# Patient Record
Sex: Female | Born: 1960
Health system: Southern US, Community
[De-identification: ages and names within clinical notes are randomized; demographics above are authoritative.]

## PROBLEM LIST (undated history)

## (undated) DIAGNOSIS — K635 Polyp of colon: Secondary | ICD-10-CM

## (undated) DIAGNOSIS — M8580 Other specified disorders of bone density and structure, unspecified site: Secondary | ICD-10-CM

## (undated) DIAGNOSIS — L719 Rosacea, unspecified: Secondary | ICD-10-CM

## (undated) HISTORY — DX: Polyp of colon: K63.5

## (undated) HISTORY — PX: NASAL SINUS SURGERY: SHX719

## (undated) HISTORY — PX: TEMPOROMANDIBULAR JOINT SURGERY: SHX35

## (undated) HISTORY — DX: Rosacea, unspecified: L71.9

---
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
## 1998-02-28 ENCOUNTER — Other Ambulatory Visit: Admission: RE | Admit: 1998-02-28 | Discharge: 1998-02-28 | Payer: Self-pay | Admitting: Obstetrics and Gynecology

## 2000-02-02 ENCOUNTER — Other Ambulatory Visit: Admission: RE | Admit: 2000-02-02 | Discharge: 2000-02-02 | Payer: Self-pay | Admitting: *Deleted

## 2000-08-19 ENCOUNTER — Other Ambulatory Visit: Admission: RE | Admit: 2000-08-19 | Discharge: 2000-08-19 | Payer: Self-pay | Admitting: Gynecology

## 2001-08-19 ENCOUNTER — Other Ambulatory Visit: Admission: RE | Admit: 2001-08-19 | Discharge: 2001-08-19 | Payer: Self-pay | Admitting: Gynecology

## 2002-08-28 ENCOUNTER — Other Ambulatory Visit: Admission: RE | Admit: 2002-08-28 | Discharge: 2002-08-28 | Payer: Self-pay | Admitting: Gynecology

## 2003-11-12 ENCOUNTER — Other Ambulatory Visit: Admission: RE | Admit: 2003-11-12 | Discharge: 2003-11-12 | Payer: Self-pay | Admitting: Gynecology

## 2004-11-19 ENCOUNTER — Other Ambulatory Visit: Admission: RE | Admit: 2004-11-19 | Discharge: 2004-11-19 | Payer: Self-pay | Admitting: Gynecology

## 2005-02-27 ENCOUNTER — Ambulatory Visit: Payer: Self-pay | Admitting: Internal Medicine

## 2005-03-03 ENCOUNTER — Ambulatory Visit: Payer: Self-pay | Admitting: Internal Medicine

## 2005-11-26 ENCOUNTER — Other Ambulatory Visit: Admission: RE | Admit: 2005-11-26 | Discharge: 2005-11-26 | Payer: Self-pay | Admitting: Gynecology

## 2006-10-01 ENCOUNTER — Ambulatory Visit: Payer: Self-pay | Admitting: Internal Medicine

## 2006-10-01 ENCOUNTER — Encounter: Payer: Self-pay | Admitting: Internal Medicine

## 2006-12-01 ENCOUNTER — Other Ambulatory Visit: Admission: RE | Admit: 2006-12-01 | Discharge: 2006-12-01 | Payer: Self-pay | Admitting: Gynecology

## 2006-12-01 ENCOUNTER — Encounter: Payer: Self-pay | Admitting: Internal Medicine

## 2007-01-04 ENCOUNTER — Encounter: Payer: Self-pay | Admitting: Internal Medicine

## 2007-05-11 ENCOUNTER — Encounter: Payer: Self-pay | Admitting: Internal Medicine

## 2007-10-06 ENCOUNTER — Encounter: Payer: Self-pay | Admitting: Internal Medicine

## 2007-12-01 ENCOUNTER — Ambulatory Visit: Payer: Self-pay | Admitting: Internal Medicine

## 2007-12-01 DIAGNOSIS — Z8742 Personal history of other diseases of the female genital tract: Secondary | ICD-10-CM

## 2007-12-01 DIAGNOSIS — J45991 Cough variant asthma: Secondary | ICD-10-CM

## 2007-12-01 DIAGNOSIS — J329 Chronic sinusitis, unspecified: Secondary | ICD-10-CM

## 2007-12-02 ENCOUNTER — Other Ambulatory Visit: Admission: RE | Admit: 2007-12-02 | Discharge: 2007-12-02 | Payer: Self-pay | Admitting: Gynecology

## 2007-12-02 ENCOUNTER — Encounter: Payer: Self-pay | Admitting: Internal Medicine

## 2008-04-10 ENCOUNTER — Encounter: Payer: Self-pay | Admitting: Internal Medicine

## 2008-10-09 ENCOUNTER — Encounter: Payer: Self-pay | Admitting: Internal Medicine

## 2008-11-29 ENCOUNTER — Ambulatory Visit: Payer: Self-pay | Admitting: Obstetrics and Gynecology

## 2008-12-26 ENCOUNTER — Ambulatory Visit: Payer: Self-pay | Admitting: Women's Health

## 2008-12-26 ENCOUNTER — Encounter: Payer: Self-pay | Admitting: Women's Health

## 2008-12-26 ENCOUNTER — Encounter: Payer: Self-pay | Admitting: Internal Medicine

## 2008-12-26 ENCOUNTER — Other Ambulatory Visit: Admission: RE | Admit: 2008-12-26 | Discharge: 2008-12-26 | Payer: Self-pay | Admitting: Gynecology

## 2009-01-02 ENCOUNTER — Telehealth (INDEPENDENT_AMBULATORY_CARE_PROVIDER_SITE_OTHER): Payer: Self-pay | Admitting: *Deleted

## 2009-01-10 ENCOUNTER — Ambulatory Visit: Payer: Self-pay | Admitting: Internal Medicine

## 2009-04-09 ENCOUNTER — Encounter: Payer: Self-pay | Admitting: Internal Medicine

## 2009-10-08 ENCOUNTER — Encounter: Payer: Self-pay | Admitting: Internal Medicine

## 2009-12-26 ENCOUNTER — Telehealth (INDEPENDENT_AMBULATORY_CARE_PROVIDER_SITE_OTHER): Payer: Self-pay | Admitting: *Deleted

## 2009-12-26 ENCOUNTER — Encounter (INDEPENDENT_AMBULATORY_CARE_PROVIDER_SITE_OTHER): Payer: Self-pay | Admitting: *Deleted

## 2010-01-29 ENCOUNTER — Ambulatory Visit: Payer: Self-pay | Admitting: Women's Health

## 2010-01-29 ENCOUNTER — Other Ambulatory Visit: Admission: RE | Admit: 2010-01-29 | Discharge: 2010-01-29 | Payer: Self-pay | Admitting: Gynecology

## 2010-01-29 ENCOUNTER — Encounter (INDEPENDENT_AMBULATORY_CARE_PROVIDER_SITE_OTHER): Payer: Self-pay | Admitting: *Deleted

## 2010-01-29 ENCOUNTER — Encounter: Payer: Self-pay | Admitting: Internal Medicine

## 2010-01-30 ENCOUNTER — Ambulatory Visit: Payer: Self-pay | Admitting: Internal Medicine

## 2010-02-20 ENCOUNTER — Ambulatory Visit: Payer: Self-pay | Admitting: Internal Medicine

## 2010-02-20 DIAGNOSIS — K635 Polyp of colon: Secondary | ICD-10-CM

## 2010-02-20 HISTORY — DX: Polyp of colon: K63.5

## 2010-02-24 ENCOUNTER — Encounter: Payer: Self-pay | Admitting: Internal Medicine

## 2010-04-11 ENCOUNTER — Encounter: Payer: Self-pay | Admitting: Internal Medicine

## 2010-06-26 NOTE — Progress Notes (Signed)
Summary: Colonoscopy referral  Phone Note Call from Patient Call back at [phone]   Summary of Call: Patient mother has been dx with colon cancer and she would like to inquire about colonoscopy referral. Would you like to see the patient in office prior? Please advise. Initial call taken by: Lucious Groves CMA,  December 26, 2009 2:27 PM  Follow-up for Phone Call        GI will need to see her first to schedule this as no recent medical evaluation in chart Follow-up by: Marga Melnick MD,  December 26, 2009 2:38 PM  Additional Follow-up for Phone Call Additional follow up Details #1::        GI will do a coloncoscopy because of family history. It is sch'd. Will inform patient.  Additional Follow-up by: Harold Barban,  December 26, 2009 2:45 PM  New Problems: NEOPLASM, MALIGNANT, COLON, FAMILY HX, MOTHER (ICD-V16.0)   New Problems: NEOPLASM, MALIGNANT, COLON, FAMILY HX, MOTHER (ICD-V16.0)

## 2010-06-26 NOTE — Miscellaneous (Signed)
Summary: [facility] previsit  Clinical Lists Changes  Medications: Added new medication of DULCOLAX 5 MG  TBEC (BISACODYL) Day before procedure take 2 at 3pm and 2 at 8pm. - Signed Added new medication of METOCLOPRAMIDE HCL 10 MG  TABS (METOCLOPRAMIDE HCL) As per prep instructions. - Signed Added new medication of MIRALAX   POWD (POLYETHYLENE GLYCOL 3350) As per prep  instructions. - Signed Rx of DULCOLAX 5 MG  TBEC (BISACODYL) Day before procedure take 2 at 3pm and 2 at 8pm.;  #4 x 0;  Signed;  Entered by: Karl Bales RN;  Authorized by: Hart Carwin MD;  Method used: Electronically to [facility] [address]. [phone]*, [address], [place], [place], [place]  [address], Ph: [phone] or [phone], Fax: [phone] Rx of METOCLOPRAMIDE HCL 10 MG  TABS (METOCLOPRAMIDE HCL) As per prep instructions.;  #2 x 0;  Signed;  Entered by: Karl Bales RN;  Authorized by: Hart Carwin MD;  Method used: Electronically to [facility] [address] [phone]*, [address], [place], [place], [place]  [address], Ph: [phone] or [phone], Fax: [phone] Rx of MIRALAX   POWD (POLYETHYLENE GLYCOL 3350) As per prep  instructions.;  #255gm x 0;  Signed;  Entered by: Karl Bales RN;  Authorized by: Hart Carwin MD;  Method used: Electronically to [facility] Dr. [phone]*, [address] [place], [place], [place]  [address], Ph: [phone] or [phone], Fax: [phone]    Prescriptions: MIRALAX   POWD (POLYETHYLENE GLYCOL 3350) As per prep  instructions.  #255gm x 0   Entered by:   Karl Bales RN   Authorized by:   Hart Carwin MD   Signed by:   Karl Bales RN on 01/30/2010   Method used:   Electronically to        [facility]* (retail)       [address]       [place], [place]  [address]       Ph: [phone] or [phone]       Fax: [phone]   RxID:   [id number] METOCLOPRAMIDE HCL 10 MG  TABS (METOCLOPRAMIDE HCL) As per  prep instructions.  #2 x 0   Entered by:   Karl Bales RN   Authorized by:   Hart Carwin MD   Signed by:   Karl Bales RN on 01/30/2010   Method used:   Electronically to        [facility]* (retail)       [address]       [place], [place]  [address]       Ph: [phone] or [phone]       Fax: [phone]   RxID:   [id number] DULCOLAX 5 MG  TBEC (BISACODYL) Day before procedure take 2 at 3pm and 2 at 8pm.  #4 x 0   Entered by:   Karl Bales RN   Authorized by:   Hart Carwin MD   Signed by:   Karl Bales RN on 01/30/2010   Method used:   Electronically to        [facility]* (retail)       [address]       [place], [place]  [address]       Ph: [phone] or [phone]  Fax: [phone]   RxID:   [id number]

## 2010-06-26 NOTE — Letter (Signed)
Summary: [facility]   Imported By: Lanelle Bal 02/11/2010 10:12:02  _____________________________________________________________________  External Attachment:    Type:   Image     Comment:   External Document

## 2010-06-26 NOTE — Letter (Signed)
Summary: [facility]  [facility]   Imported By: Lanelle Bal 10/26/2009 09:59:44  _____________________________________________________________________  External Attachment:    Type:   Image     Comment:   External Document

## 2010-06-26 NOTE — Letter (Signed)
Summary: [facility] Instructions  [facility]  [address] [place], [place] [address]   Phone: [phone]  Fax: [phone]       Tara Carpenter    Sep 20, 1960    MRN: [id number]       Procedure Day Dorna Bloom:  Lenor Coffin  02/20/10     Arrival Time: 9:00AM     Procedure Time:  10:00AM     Location of Procedure:                    [facility]iann Pares _  [facility] (4th Floor)   PREPARATION FOR COLONOSCOPY WITH MIRALAX  Starting 5 days prior to your procedure 02/15/10 do not eat nuts, seeds, popcorn, corn, beans, peas,  salads, or any raw vegetables.  Do not take any fiber supplements (e.g. Metamucil, Citrucel, and Benefiber). ____________________________________________________________________________________________________   THE DAY BEFORE YOUR PROCEDURE         DATE: 02/19/10 DAY: WEDNESDAY  1   Drink clear liquids the entire day-NO SOLID FOOD  2   Do not drink anything colored red or purple.  Avoid juices with pulp.  No orange juice.  3   Drink at least 64 oz. (8 glasses) of fluid/clear liquids during the day to prevent dehydration and help the prep work efficiently.  CLEAR LIQUIDS INCLUDE: Water Jello Ice Popsicles Tea (sugar ok, no milk/cream) Powdered fruit flavored drinks Coffee (sugar ok, no milk/cream) Gatorade Juice: apple, white grape, white cranberry  Lemonade Clear bullion, consomm, broth Carbonated beverages (any kind) Strained chicken noodle soup Hard Candy  4   Mix the entire bottle of Miralax with 64 oz. of Gatorade/Powerade in the morning and put in the refrigerator to chill.  5   At 3:00 pm take 2 Dulcolax/Bisacodyl tablets.  6   At 4:30 pm take one Reglan/Metoclopramide tablet.  7  Starting at 5:00 pm drink one 8 oz glass of the Miralax mixture every 15-20 minutes until you have finished drinking the entire 64 oz.  You should finish drinking prep around 7:30 or 8:00 pm.  8   If you are nauseated, you may take the 2nd Reglan/Metoclopramide  tablet at 6:30 pm.        9    At 8:00 pm take 2 more DULCOLAX/Bisacodyl tablets.     THE DAY OF YOUR PROCEDURE      DATE:  02/20/10  DAY: Lenor Coffin  You may drink clear liquids until 8:00AM  (2 HOURS BEFORE PROCEDURE).   MEDICATION INSTRUCTIONS  Unless otherwise instructed, you should take regular prescription medications with a small sip of water as early as possible the morning of your procedure.           OTHER INSTRUCTIONS  You will need a responsible adult at least 49 years of age to accompany you and drive you home.   This person must remain in the waiting room during your procedure.  Wear loose fitting clothing that is easily removed.  Leave jewelry and other valuables at home.  However, you may wish to bring a book to read or an iPod/MP3 player to listen to music as you wait for your procedure to start.  Remove all body piercing jewelry and leave at home.  Total time from sign-in until discharge is approximately 2-3 hours.  You should go home directly after your procedure and rest.  You can resume normal activities the day after your procedure.  The day of your procedure you should not:   Drive  Make legal decisions   Operate machinery   Drink alcohol   Return to work  You will receive specific instructions about eating, activities and medications before you leave.   The above instructions have been reviewed and explained to me by  Karl Bales RN  January 30, 2010 11:25 AM    I fully understand and can verbalize these instructions _____________________________ Date _______

## 2010-06-26 NOTE — Letter (Signed)
Summary: Patient Notice- Polyp Results  [facility]  [address] [place], [place] [address]   Phone: [phone]  Fax: [phone]        February 24, 2010 MRN: [id number]    SYLVIE MIFSUD [address] [place], [place]  [address]    Dear Ms. Ayub,  I am pleased to inform you that the colon polyp(s) removed during your recent colonoscopy was (were) found to be benign (no cancer detected) upon pathologic examination.The polyp was adenomatous ( precancerous)  I recommend you have a repeat colonoscopy examination in 5_ years to look for recurrent polyps, as having colon polyps increases your risk for having recurrent polyps or even colon cancer in the future.  Should you develop new or worsening symptoms of abdominal pain, bowel habit changes or bleeding from the rectum or bowels, please schedule an evaluation with either your primary care physician or with me.  Additional information/recommendations:  _x_ No further action with gastroenterology is needed at this time. Please      follow-up with your primary care physician for your other healthcare      needs.  __ Please call [phone] to schedule a return visit to review your      situation.  __ Please keep your follow-up visit as already scheduled.  __ Continue treatment plan as outlined the day of your exam.  Please call us if you are having persistent problems or have questions about your condition that have not been fully answered at this time.  Sincerely,  Hart Carwin MD  This letter has been electronically signed by your physician.  Appended Document: Patient Notice- Polyp Results letter mailed

## 2010-06-26 NOTE — Letter (Signed)
Summary: Previsit letter  [facility]  [address] [place], [place] [address]   Phone: [phone]  Fax: [phone]       12/26/2009 MRN: [id number]  Tara Carpenter [address] [place], [place]  [address]  Dear Ms. Acri,  Welcome to the Gastroenterology Division at [facility].    You are scheduled to see a nurse for your pre-procedure visit on 01-29-10 at 8:00a.m. on the 3rd floor at [facility], [address]  We ask that you try to arrive at our office 15 minutes prior to your appointment time to allow for check-in.  Your nurse visit will consist of discussing your medical and surgical history, your immediate family medical history, and your medications.    Please bring a complete list of all your medications or, if you prefer, bring the medication bottles and we will list them.  We will need to be aware of both prescribed and over the counter drugs.  We will need to know exact dosage information as well.  If you are on blood thinners (Coumadin, Plavix, Aggrenox, Ticlid, etc.) please call our office today/prior to your appointment, as we need to consult with your physician about holding your medication.   Please be prepared to read and sign documents such as consent forms, a financial agreement, and acknowledgement forms.  If necessary, and with your consent, a friend or relative is welcome to sit-in on the nurse visit with you.  Please bring your insurance card so that we may make a copy of it.  If your insurance requires a referral to see a specialist, please bring your referral form from your primary care physician.  No co-pay is required for this nurse visit.     If you cannot keep your appointment, please call [phone] to cancel or reschedule prior to your appointment date.  This allows [place] the opportunity to schedule an appointment for another patient in need of care.    Thank you for choosing [facility] for your medical needs.   We appreciate the opportunity to care for you.  Please visit [place] at our website  to learn more about our practice.                     Sincerely.                                                                                                                   The Gastroenterology Division

## 2010-06-26 NOTE — Procedures (Signed)
Summary: Colonoscopy  Patient: Tara Carpenter Note: All result statuses are Final unless otherwise noted.  Tests: (1) Colonoscopy (COL)   COL Colonoscopy           DONE     [facility]     [address]     [place], [place]  [address]           COLONOSCOPY PROCEDURE REPORT           PATIENT:  Tara, Carpenter  MR#:  [id number]     BIRTHDATE:  10-23-1960, 49 yrs. old  GENDER:  female     ENDOSCOPIST:  Hedwig Morton. Juanda Chance, MD     REF. BY:  Marga Melnick, M.D.     PROCEDURE DATE:  02/20/2010     PROCEDURE:  Colonoscopy 19147     ASA CLASS:  Class I     INDICATIONS:  Elevated Risk Screening mother with colon ca just     diagnosed, Stage 4,     MEDICATIONS:   Versed 8 mg, Fentanyl 75 mcg           DESCRIPTION OF PROCEDURE:   After the risks benefits and     alternatives of the procedure were thoroughly explained, informed     consent was obtained.  Digital rectal exam was performed and     revealed no rectal masses.   The LB [id number] endoscope     was introduced through the anus and advanced to the cecum, which     was identified by both the appendix and ileocecal valve, without     limitations.  The quality of the prep was excellent, using     MiraLax.  The instrument was then slowly withdrawn as the colon     was fully examined.     <<PROCEDUREIMAGES>>           FINDINGS:  Two polyps were found in the sigmoid colon. 5mm polyp     at 20 cm, 9mm polyp at 15 cm, sessile Polyps were snared without     cautery. Retrieval was successful (see image2, image6, image7, and     image1). snare polyp  This was otherwise a normal examination of     the colon (see image8, image5, image4, and image3).   Retroflexed     views in the rectum revealed no abnormalities.    The scope was     then withdrawn from the patient and the procedure completed.           COMPLICATIONS:  None     ENDOSCOPIC IMPRESSION:     1) Two polyps in the sigmoid colon     2) Otherwise normal examination   s/p cold snare polypectomy x 2     RECOMMENDATIONS:     1) Await pathology results     2) High fiber diet.     h     REPEAT EXAM:  In 5 year(s) for.           ______________________________     Hedwig Morton. Juanda Chance, MD           CC:           n.     eSIGNED:   Hedwig Morton. Brodie at 02/20/2010 10:04 AM           Benard Halsted, [phone]  Note: An exclamation mark (!) indicates a result that was not dispersed into the flowsheet. Document Creation Date: 02/20/2010 10:05 AM _______________________________________________________________________  (  1) Order result status: Final Collection or observation date-time: 02/20/2010 09:55 Requested date-time:  Receipt date-time:  Reported date-time:  Referring Physician:   Ordering Physician: Lina Sar [phone]) Specimen Source:  Source: Launa Grill Order Number: [id number] Lab site:   Appended Document: Colonoscopy     Procedures Next Due Date:    Colonoscopy: 02/2015

## 2010-07-24 ENCOUNTER — Other Ambulatory Visit: Payer: Self-pay | Admitting: Dermatology

## 2010-10-21 ENCOUNTER — Encounter: Payer: Self-pay | Admitting: Internal Medicine

## 2010-12-01 ENCOUNTER — Ambulatory Visit (INDEPENDENT_AMBULATORY_CARE_PROVIDER_SITE_OTHER): Payer: Managed Care, Other (non HMO) | Admitting: Women's Health

## 2010-12-01 DIAGNOSIS — Z79890 Hormone replacement therapy (postmenopausal): Secondary | ICD-10-CM

## 2010-12-01 DIAGNOSIS — N951 Menopausal and female climacteric states: Secondary | ICD-10-CM

## 2011-02-04 ENCOUNTER — Other Ambulatory Visit (HOSPITAL_COMMUNITY)
Admission: RE | Admit: 2011-02-04 | Discharge: 2011-02-04 | Disposition: A | Discharge status: Home / Self Care | Payer: Managed Care, Other (non HMO) | Source: Ambulatory Visit | Attending: Women's Health | Admitting: Women's Health

## 2011-02-04 ENCOUNTER — Ambulatory Visit (INDEPENDENT_AMBULATORY_CARE_PROVIDER_SITE_OTHER): Payer: Managed Care, Other (non HMO) | Admitting: Women's Health

## 2011-02-04 ENCOUNTER — Encounter: Payer: Self-pay | Admitting: Women's Health

## 2011-02-04 VITALS — BP 116/74 | Ht 63.0 in | Wt 137.0 lb

## 2011-02-04 DIAGNOSIS — Z833 Family history of diabetes mellitus: Secondary | ICD-10-CM

## 2011-02-04 DIAGNOSIS — Z01419 Encounter for gynecological examination (general) (routine) without abnormal findings: Secondary | ICD-10-CM

## 2011-02-04 DIAGNOSIS — Z780 Asymptomatic menopausal state: Secondary | ICD-10-CM

## 2011-02-04 DIAGNOSIS — N951 Menopausal and female climacteric states: Secondary | ICD-10-CM

## 2011-02-04 DIAGNOSIS — R82998 Other abnormal findings in urine: Secondary | ICD-10-CM

## 2011-02-04 DIAGNOSIS — Z13220 Encounter for screening for lipoid disorders: Secondary | ICD-10-CM

## 2011-02-04 MED ORDER — PROGESTERONE MICRONIZED 200 MG PO CAPS: 200.0000 mg | ORAL_CAPSULE | Freq: Every day | ORAL | Status: DC

## 2011-02-04 MED ORDER — ESTRADIOL 0.05 MG/24HR TD PTTW: 1.0000 | MEDICATED_PATCH | TRANSDERMAL | Status: DC

## 2011-02-04 NOTE — Patient Instructions (Signed)
dexa Vit d 1000 Fish oil daily  <20 gm sat fat /d >20 fiber rich foods

## 2011-02-04 NOTE — Progress Notes (Signed)
Tara Carpenter 05-23-1961 [id number]    History:    The patient presents for annual exam.  History of a normal colonoscopy in Oct 17, 2009, mother died of colon cancer at age 49. She also has chronic sinusitis with asthma that is controlled with prednisone, Sporanox and Singulair prescribed by her pulmonologist..   Past medical history, past surgical history, family history and social history were all reviewed and documented in the EPIC chart.   ROS:  A  ROS was performed and pertinent positives and negatives are included in the history.  Exam:  Filed Vitals:   02/04/11 0813  BP: 116/74    General appearance:  Normal Head/Neck:  Normal, without cervical or supraclavicular adenopathy. Thyroid:  Symmetrical, normal in size, without palpable masses or nodularity. Respiratory  Effort:  Normal  Auscultation:  Clear without wheezing or rhonchi Cardiovascular  Auscultation:  Regular rate, without rubs, murmurs or gallops  Edema/varicosities:  Not grossly evident Abdominal  Soft,nontender, without masses, guarding or rebound.  Liver/spleen:  No organomegaly noted  Hernia:  None appreciated  Skin  Inspection:  Grossly normal  Palpation:  Grossly normal Neurologic/psychiatric  Orientation:  Normal with appropriate conversation.  Mood/affect:  Normal  Genitourinary    Breasts: Examined lying and sitting.     Right: Without masses, retractions, discharge or axillary adenopathy.     Left: Without masses, retractions, discharge or axillary adenopathy.   Inguinal/mons:  Normal without inguinal adenopathy  External genitalia:  Normal  BUS/Urethra/Skene's glands:  Normal  Bladder:  Normal  Vagina:  Normal  Cervix:  Normal  Uterus:  retroverted, normal in size, shape and contour.  Midline and mobile  Adnexa/parametria:     Rt: Without masses or tenderness.   Lt: Without masses or tenderness.  Anus and perineum: Normal  Digital rectal exam: Normal sphincter tone without palpated masses or  tenderness  Assessment/Plan:  50 y.o. MWF G3P3 for annual exam/husband vasectomy.Marland Kitchen FSH was 84 July of 2012 with numerous menopausal symptoms, had been amenorrheic for 3 months, and was started on Prometrium 200 day one through 12 and estradiol patch 0.05 weekly. She has done well on HRT, except states is still having a monthly cycle and wonders if she could prevent back. Did review she may spot daily if giving continuous progesterone. Will continue with the monthly withdrawal did review this will hopefully lighten. Did review risks of blood clots,strokes and accepts. History of GDM,  Normal GYN  exam on HRT with good relief of symptoms.  Plan: CBC, glucose, lipid profile, vitamin D, UA and Pap schedule bone density here at the office. Continue regular exercise, calcium rich diet, vitamin D 1000 daily encouraged SBEs, yearly mammogram, due in December. She's had some elevated triglycerides in the past encouraged increase fiber rich foods greater than 20 g per day and decreasing saturated fat to less than 20. She was seen by primary care who elected not to place on cholesterol-lowering medication.    Harrington Challenger [facility], 9:19 AM 02/04/2011

## 2011-03-13 ENCOUNTER — Telehealth: Payer: Self-pay | Admitting: *Deleted

## 2011-03-13 NOTE — Telephone Encounter (Signed)
Patient called and asked to verify how many patches she uses a week.  Checked number ordered was 8 patches.  Told patient to use twice weekly.

## 2011-03-23 ENCOUNTER — Telehealth: Payer: Self-pay | Admitting: Internal Medicine

## 2011-03-23 NOTE — Telephone Encounter (Signed)
Spoke with patient and recommended ER, patient stated this is not a chest pain, feels like something is holding her breath (last about 3-5 sec), happens at least once daily x 3-4 weeks. Patient repeatedly said this is not chest pain, I still recommended ER. Patient requested appointment with Dr.Hopper for tomorrow and indicated she will go to ER if symptoms become unbearable.  Appointment scheduled

## 2011-03-24 ENCOUNTER — Encounter: Payer: Self-pay | Admitting: Internal Medicine

## 2011-03-24 ENCOUNTER — Ambulatory Visit (INDEPENDENT_AMBULATORY_CARE_PROVIDER_SITE_OTHER): Payer: Managed Care, Other (non HMO) | Admitting: Internal Medicine

## 2011-03-24 DIAGNOSIS — Z8742 Personal history of other diseases of the female genital tract: Secondary | ICD-10-CM

## 2011-03-24 DIAGNOSIS — R0789 Other chest pain: Secondary | ICD-10-CM

## 2011-03-24 DIAGNOSIS — R002 Palpitations: Secondary | ICD-10-CM

## 2011-03-24 DIAGNOSIS — Z8249 Family history of ischemic heart disease and other diseases of the circulatory system: Secondary | ICD-10-CM

## 2011-03-24 LAB — TSH: TSH: 1.19 u[IU]/mL (ref 0.35–5.50)

## 2011-03-24 LAB — HEMOGLOBIN A1C: Hgb A1c MFr Bld: 5.8 % (ref 4.6–6.5)

## 2011-03-24 LAB — BASIC METABOLIC PANEL
CO2: 28 mEq/L (ref 19–32)
Calcium: 9.2 mg/dL (ref 8.4–10.5)
Creatinine, Ser: 0.7 mg/dL (ref 0.4–1.2)
GFR: 88.61 mL/min (ref >60.00)
Sodium: 141 mEq/L (ref 135–145)

## 2011-03-24 LAB — MAGNESIUM: Magnesium: 2.3 mg/dL (ref 1.5–2.5)

## 2011-03-24 LAB — T4, FREE: Free T4: 0.76 ng/dL (ref 0.60–1.60)

## 2011-03-24 NOTE — Patient Instructions (Signed)
Please  schedule fasting Labs : [facility] panel (no TSH or uric acid).  Please bring these instructions to that Lab appt.

## 2011-03-24 NOTE — Progress Notes (Signed)
  Subjective:    Patient ID: Tara Carpenter, female    DOB: [birth year], 50 y.o.   MRN: [id number]  HPI CHEST DISCOMFORT: Location: LSB  Quality: "constricting"  Duration: few seconds  Onset : 1 year ago; daily x 3 weeks Radiation: no  Better with: no relievers  Worse with: no triggers or exacerbating factors Symptoms History of Trauma/lifting: no  Nausea/vomiting: no  Diaphoresis: no  Shortness of breath: no  Pleuritic: no  Cough: no  Edema: no  Orthopnea: no  PND: no ; separate unassociated asthma symptoms  Dizziness: yes, with the pressure  Palpitations: yes, this occurs first  Syncope: no  Indigestion: no   Red Flags Worse with exertion: no  Recent Immobility: no  Tearing/radiation to back: no  Family history is positive for myocardial infarction in her father; he died of a myocardial infarction at 67.       Review of Systems she denies dysphagia, melena, rectal bleeding,  or abdominal pain     Objective:   Physical Exam General appearance is one of good health and nourishment w/o distress.  Eyes: No conjunctival inflammation or scleral icterus is present.  Oral exam: Dental hygiene is good; lips and gums are healthy appearing.There is no oropharyngeal erythema or exudate noted.   Heart:  Normal rate and regular rhythm. S1 and S2 normal without gallop, murmur, click, rub or other extra sounds     Lungs:Chest clear to auscultation; no wheezes, rhonchi,rales ,or rubs present.No increased work of breathing. There is no chest wall tenderness in the area of the pain.  Abdomen: bowel sounds normal, soft and non-tender without masses, organomegaly or hernias noted.  No guarding or rebound   Skin:Warm & dry.  Intact without suspicious lesions or rashes ; no jaundice  Lymphatic: No lymphadenopathy is noted about the head, neck, axilla  areas.   Muscle skeletal: No clubbing, cyanosis, or edema. Homans sign is negative bilaterally.            Assessment &  Plan:  #1 chest pain, atypical. Last episode 10/29  #2 family history of premature coronary disease in her father; MI at 80  #3 postmenopausal state  Plan: See orders and recommendations.  EKG is normal. The computer described low voltage in the precordial leads. This is related to breast tissue and is normal. No ischemic changes are present; no dysrhythmias noted

## 2011-03-25 LAB — CK TOTAL AND CKMB (NOT AT ARMC)
CK, MB: 1.3 ng/mL (ref 0.3–4.0)
Total CK: 51 U/L (ref 7–177)

## 2011-03-25 LAB — D-DIMER, QUANTITATIVE: D-Dimer, Quant: 0.25 ug/mL-FEU (ref 0.00–0.48)

## 2011-04-01 ENCOUNTER — Other Ambulatory Visit: Payer: Managed Care, Other (non HMO)

## 2011-04-08 ENCOUNTER — Other Ambulatory Visit (INDEPENDENT_AMBULATORY_CARE_PROVIDER_SITE_OTHER): Payer: Managed Care, Other (non HMO)

## 2011-04-08 DIAGNOSIS — Z8249 Family history of ischemic heart disease and other diseases of the circulatory system: Secondary | ICD-10-CM

## 2011-04-08 DIAGNOSIS — R002 Palpitations: Secondary | ICD-10-CM

## 2011-04-08 DIAGNOSIS — R0789 Other chest pain: Secondary | ICD-10-CM

## 2011-04-08 NOTE — Progress Notes (Signed)
12  

## 2011-05-08 ENCOUNTER — Encounter: Payer: Self-pay | Admitting: Internal Medicine

## 2011-05-14 ENCOUNTER — Encounter: Payer: Self-pay | Admitting: Women's Health

## 2011-05-29 ENCOUNTER — Encounter: Payer: Self-pay | Admitting: Internal Medicine

## 2011-05-29 ENCOUNTER — Ambulatory Visit (INDEPENDENT_AMBULATORY_CARE_PROVIDER_SITE_OTHER): Payer: Managed Care, Other (non HMO) | Admitting: Internal Medicine

## 2011-05-29 DIAGNOSIS — E785 Hyperlipidemia, unspecified: Secondary | ICD-10-CM | POA: Insufficient documentation

## 2011-05-29 DIAGNOSIS — Z8249 Family history of ischemic heart disease and other diseases of the circulatory system: Secondary | ICD-10-CM

## 2011-05-29 MED ORDER — NIACIN ER (ANTIHYPERLIPIDEMIC) 500 MG PO TBCR: 500.0000 mg | EXTENDED_RELEASE_TABLET | Freq: Every day | ORAL | Status: DC

## 2011-05-29 MED ORDER — EZETIMIBE 10 MG PO TABS: 10.0000 mg | ORAL_TABLET | Freq: Every day | ORAL | Status: DC

## 2011-05-29 NOTE — Assessment & Plan Note (Addendum)
[facility] panel 04/08/11: Hyperabsorber of  cholesterol; direct LDL 160;Lp(a) 109 (< 20); TG 185;LpPLA2 267 (<200). Therapeutic interventions would include statin plus or minus ezetimibe; Niaspan; and low-carb nutritional program.  She takes Sporanox chronically for her chronic sinusitis. A statin would not be an option.  Zetia, Niaspan, and optimal nutrition will be pursued.

## 2011-05-29 NOTE — Patient Instructions (Signed)
The most common cause of elevated triglycerides is the ingestion of sugar from high fructose corn syrup sources added to processed foods & drinks.  Eat a low-fat diet with lots of fruits and vegetables, up to 7-9 servings per day. Consume less than 30 grams of sugar per day from foods & drinks with High Fructose Corn Syrup (HFCS) sugar as #1,2,3 or # 4 on label.Whole Foods, [employer] & [employer] do not carry products with HFCS. Follow a  low carb nutrition program such as [place] or The New Sugar Busters  to prevent Diabetes progression . White carbohydrates (potatoes, rice, bread, and pasta) have a high spike of sugar and a high load of sugar. For example a  baked potato has a cup of sugar and a  french fry  2 teaspoons of sugar. Yams, wild  rice, whole grained bread &  wheat pasta have been much lower spike and load of  sugar. Portions should be the size of a deck of cards or your palm.  Please  schedule fasting Labs : Lipids, hepatic panel, CK in 10-12 weeks after meds & diet changes.  Please review Dr Gildardo Griffes book Eat, Drink & Be Healthy for dietary cholesterol information.  PLEASE BRING THESE INSTRUCTIONS TO FOLLOW UP  LAB APPOINTMENT.This will guarantee correct labs are drawn, eliminating need for repeat blood sampling ( needle sticks ! ). Diagnoses /Codes: 272.4,995.20,V17.3

## 2011-05-29 NOTE — Progress Notes (Signed)
  Subjective:    Patient ID: Tara Carpenter, female    DOB: [birth year], 51 y.o.   MRN: [id number]  HPI Dyslipidemia assessment: Prior Advanced Lipid Testing: NMR 2010   Family history of premature CAD/ MI: father MI @ 45 .  Nutrition: heart healthy .  Exercise: 4-5 x/ week including cardio . Diabetes : A1c 5.4% . HTN: no. Smoking history  : never .    Lab results reviewed :     Review of Systems Weight : stable. ROS: fatigue: no ; chest pain : no ;claudication: no; palpitations: no; abd pain/bowel changes: no ; myalgias:no;  syncope : no ; skin changes: no.     Objective:   Physical Exam   She is healthy and well-nourished; weight appears to be optimal.  There is no scleral icterus or jaundice.  Thyroid is normal to palpation without enlargement or nodularity.  She has an S4 with no significant murmur or gallops. Rhythm and rate are normal.  Chest is clear to auscultation with no increased work of breathing  All pulses are intact without bruits or deficit.  She is focused and intelligent and understands the risk we discussed in detail.        Assessment & Plan:

## 2011-07-16 ENCOUNTER — Ambulatory Visit (INDEPENDENT_AMBULATORY_CARE_PROVIDER_SITE_OTHER): Payer: Managed Care, Other (non HMO) | Admitting: Women's Health

## 2011-07-16 ENCOUNTER — Encounter: Payer: Self-pay | Admitting: Women's Health

## 2011-07-16 DIAGNOSIS — Z79890 Hormone replacement therapy (postmenopausal): Secondary | ICD-10-CM

## 2011-07-16 DIAGNOSIS — N9089 Other specified noninflammatory disorders of vulva and perineum: Secondary | ICD-10-CM

## 2011-07-16 MED ORDER — ESTRADIOL 0.05 MG/24HR TD PTWK: 1.0000 | MEDICATED_PATCH | TRANSDERMAL | Status: DC

## 2011-07-16 NOTE — Progress Notes (Signed)
Patient ID: FATIMAH SUNDQUIST, female   DOB: [birth year], 51 y.o.   MRN: [id number] Has been on estradiol 0.05 patch weekly and Prometrium 200 daily 1 through 12 with good relief of  menopausal symptoms since 7/12. Has had some irregular bleeding some months. Currently on cycle. States does bleed for several days after finishing the Prometrium each month. States also is having an irritation on the left labia. Denies any urinary symptoms or vaginal discharge.  Exam: Left labia 1 cm ulcerated superficial area. No blister areas noted, mild erythema, minimal tenderness. Denies HSV history.  DUB on HRT Left labial ulcer  Plan: HSV culture of left labia pending. Will apply triple antibiotic cream to it  until culture results. Encouraged loose close to avoid further irritation. Instructed to keep menstrual record and will call office with timing of bleeding or spotting. Reviewed normal to have some bleeding after Prometrium initially.

## 2011-07-20 ENCOUNTER — Encounter: Payer: Self-pay | Admitting: Women's Health

## 2011-07-21 ENCOUNTER — Encounter: Payer: Self-pay | Admitting: Women's Health

## 2011-07-22 ENCOUNTER — Ambulatory Visit (INDEPENDENT_AMBULATORY_CARE_PROVIDER_SITE_OTHER): Payer: Managed Care, Other (non HMO)

## 2011-07-22 DIAGNOSIS — Z780 Asymptomatic menopausal state: Secondary | ICD-10-CM

## 2011-07-22 DIAGNOSIS — M949 Disorder of cartilage, unspecified: Secondary | ICD-10-CM

## 2011-07-22 DIAGNOSIS — M8580 Other specified disorders of bone density and structure, unspecified site: Secondary | ICD-10-CM

## 2011-07-23 ENCOUNTER — Encounter: Payer: Self-pay | Admitting: Gynecology

## 2011-07-23 ENCOUNTER — Other Ambulatory Visit: Payer: Self-pay | Admitting: Gynecology

## 2011-07-23 DIAGNOSIS — M8580 Other specified disorders of bone density and structure, unspecified site: Secondary | ICD-10-CM

## 2011-09-04 ENCOUNTER — Telehealth: Payer: Self-pay | Admitting: *Deleted

## 2011-09-04 NOTE — Telephone Encounter (Signed)
Telephone call, states has bleeding most months twice a month now sometimes during the Prometrium other times after. Frequency of bleeding has increased. States bleeding lasts 4-5 days, sometimes bright red or dark. States menopausal symptoms are resolved. Reviewed not normal. Will get a sonohysterogram scheduled with Dr. Lily Peer to be sure no problem in the uterus. Postmenopausal on estradiol patch weekly and Prometrium 200 day one through 12 each month  Victorino Dike  Please call patient and schedule sonohysterogram with Dr Lily Peer

## 2011-09-04 NOTE — Telephone Encounter (Signed)
PT IS CALLING TO FOLLOW UP WITH LAST OFFICE VISIT 07/16/11 REGARDING BLEEDING WHILE ON HRT. PT SAID THAT SINCE FEB 21 OV SHE HAS HAD 4 PERIOD AND IS TRIED OF IT. PT WAS TOLD TO CALL. PLEASE ADVISE

## 2011-09-04 NOTE — Telephone Encounter (Signed)
Message left on both cell and home phone number.

## 2011-09-06 NOTE — Telephone Encounter (Signed)
Tara Carpenter has sonohysterogram been scheduled this week fo this patient with me? [name] had sent you a note requested that it be scheduled. I just want to follow up

## 2011-09-07 NOTE — Telephone Encounter (Signed)
Spoke with pt regarding the below note, pt transferred to appointment desk to schedule.

## 2011-09-10 ENCOUNTER — Other Ambulatory Visit: Payer: Self-pay | Admitting: Gynecology

## 2011-09-10 DIAGNOSIS — N950 Postmenopausal bleeding: Secondary | ICD-10-CM

## 2011-09-17 ENCOUNTER — Other Ambulatory Visit: Payer: Self-pay | Admitting: Gynecology

## 2011-09-17 ENCOUNTER — Ambulatory Visit (INDEPENDENT_AMBULATORY_CARE_PROVIDER_SITE_OTHER): Payer: BC Managed Care – PPO | Admitting: Gynecology

## 2011-09-17 ENCOUNTER — Ambulatory Visit (INDEPENDENT_AMBULATORY_CARE_PROVIDER_SITE_OTHER): Payer: BC Managed Care – PPO

## 2011-09-17 DIAGNOSIS — N950 Postmenopausal bleeding: Secondary | ICD-10-CM

## 2011-09-17 DIAGNOSIS — N8300 Follicular cyst of ovary, unspecified side: Secondary | ICD-10-CM

## 2011-09-17 DIAGNOSIS — N831 Corpus luteum cyst of ovary, unspecified side: Secondary | ICD-10-CM

## 2011-09-17 DIAGNOSIS — N83209 Unspecified ovarian cyst, unspecified side: Secondary | ICD-10-CM

## 2011-09-17 NOTE — Patient Instructions (Signed)
Ovarian Cyst The ovaries are small organs that are on each side of the uterus. The ovaries are the organs that produce the female hormones, estrogen and progesterone. An ovarian cyst is a sac filled with fluid that can vary in its size. It is normal for a small cyst to form in women who are in the childbearing age and who have menstrual periods. This type of cyst is called a follicle cyst that becomes an ovulation cyst (corpus luteum cyst) after it produces the women's egg. It later goes away on its own if the woman does not become pregnant. There are other kinds of ovarian cysts that may cause problems and may need to be treated. The most serious problem is a cyst with cancer. It should be noted that menopausal women who have an ovarian cyst are at a higher risk of it being a cancer cyst. They should be evaluated very quickly, thoroughly and followed closely. This is especially true in menopausal women because of the high rate of ovarian cancer in women in menopause. CAUSES AND TYPES OF OVARIAN CYSTS:  FUNCTIONAL CYST: The follicle/corpus luteum cyst is a functional cyst that occurs every month during ovulation with the menstrual cycle. They go away with the next menstrual cycle if the woman does not get pregnant. Usually, there are no symptoms with a functional cyst.   ENDOMETRIOMA CYST: This cyst develops from the lining of the uterus tissue. This cyst gets in or on the ovary. It grows every month from the bleeding during the menstrual period. It is also called a "chocolate cyst" because it becomes filled with blood that turns brown. This cyst can cause pain in the lower abdomen during intercourse and with your menstrual period.   CYSTADENOMA CYST: This cyst develops from the cells on the outside of the ovary. They usually are not cancerous. They can get very big and cause lower abdomen pain and pain with intercourse. This type of cyst can twist on itself, cut off its blood supply and cause severe pain.  It also can easily rupture and cause a lot of pain.   DERMOID CYST: This type of cyst is sometimes found in both ovaries. They are found to have different kinds of body tissue in the cyst. The tissue includes skin, teeth, hair, and/or cartilage. They usually do not have symptoms unless they get very big. Dermoid cysts are rarely cancerous.   POLYCYSTIC OVARY: This is a rare condition with hormone problems that produces many small cysts on both ovaries. The cysts are follicle-like cysts that never produce an egg and become a corpus luteum. It can cause an increase in body weight, infertility, acne, increase in body and facial hair and lack of menstrual periods or rare menstrual periods. Many women with this problem develop type 2 diabetes. The exact cause of this problem is unknown. A polycystic ovary is rarely cancerous.   THECA LUTEIN CYST: Occurs when too much hormone (human chorionic gonadotropin) is produced and over-stimulates the ovaries to produce an egg. They are frequently seen when doctors stimulate the ovaries for invitro-fertilization (test tube babies).   LUTEOMA CYST: This cyst is seen during pregnancy. Rarely it can cause an obstruction to the birth canal during labor and delivery. They usually go away after delivery.  SYMPTOMS   Pelvic pain or pressure.   Pain during sexual intercourse.   Increasing girth (swelling) of the abdomen.   Abnormal menstrual periods.   Increasing pain with menstrual periods.   You stop having   menstrual periods and you are not pregnant.  DIAGNOSIS  The diagnosis can be made during:  Routine or annual pelvic examination (common).   Ultrasound.   X-ray of the pelvis.   CT Scan.   MRI.   Blood tests.  TREATMENT   Treatment may only be to follow the cyst monthly for 2 to 3 months with your caregiver. Many go away on their own, especially functional cysts.   May be aspirated (drained) with a long needle with ultrasound, or by laparoscopy  (inserting a tube into the pelvis through a small incision).   The whole cyst can be removed by laparoscopy.   Sometimes the cyst may need to be removed through an incision in the lower abdomen.   Hormone treatment is sometimes used to help dissolve certain cysts.   Birth control pills are sometimes used to help dissolve certain cysts.  HOME CARE INSTRUCTIONS  Follow your caregiver's advice regarding:  Medicine.   Follow up visits to evaluate and treat the cyst.   You may need to come back or make an appointment with another caregiver, to find the exact cause of your cyst, if your caregiver is not a gynecologist.   Get your yearly and recommended pelvic examinations and Pap tests.   Let your caregiver know if you have had an ovarian cyst in the past.  SEEK MEDICAL CARE IF:   Your periods are late, irregular, they stop, or are painful.   Your stomach (abdomen) or pelvic pain does not go away.   Your stomach becomes larger or swollen.   You have pressure on your bladder or trouble emptying your bladder completely.   You have painful sexual intercourse.   You have feelings of fullness, pressure, or discomfort in your stomach.   You lose weight for no apparent reason.   You feel generally ill.   You become constipated.   You lose your appetite.   You develop acne.   You have an increase in body and facial hair.   You are gaining weight, without changing your exercise and eating habits.   You think you are pregnant.  SEEK IMMEDIATE MEDICAL CARE IF:   You have increasing abdominal pain.   You feel sick to your stomach (nausea) and/or vomit.   You develop a fever that comes on suddenly.   You develop abdominal pain during a bowel movement.   Your menstrual periods become heavier than usual.  Document Released: 05/11/2005 Document Revised: 04/30/2011 Document Reviewed: 03/14/2009 [employer] Patient Information 2012 [employer]  Intrauterine Device  Information  An intrauterine device (IUD) is inserted into your uterus and prevents pregnancy. There are 2 types of IUDs available:  Copper IUD. This type of IUD is wrapped in copper wire and is placed inside the uterus. Copper makes the uterus and fallopian tubes produce a fluid that kills sperm. The copper IUD can stay in place for 10 years.   Hormone IUD. This type of IUD contains the hormone progestin (synthetic progesterone). The hormone thickens the cervical mucus and prevents sperm from entering the uterus, and it also thins the uterine lining to prevent implantation of a fertilized egg. The hormone can weaken or kill the sperm that get into the uterus. The hormone IUD can stay in place for 5 years.  Your caregiver will make sure you are a good candidate for a contraceptive IUD. Discuss with your caregiver the possible side effects. ADVANTAGES  It is highly effective, reversible, long-acting, and low maintenance.  There are no estrogen-related side effects.   An IUD can be used when breastfeeding.   It is not associated with weight gain.   It works immediately after insertion.   The copper IUD does not interfere with your female hormones.   The progesterone IUD can make heavy menstrual periods lighter.   The progesterone IUD can be used for 5 years.   The copper IUD can be used for 10 years.  DISADVANTAGES  The progesterone IUD can be associated with irregular bleeding patterns.   The copper IUD can make your menstrual flow heavier and more painful.   You may experience cramping and vaginal bleeding after insertion.  Document Released: 04/14/2004 Document Revised: 04/30/2011 Document Reviewed: 09/13/2010 [facility] Patient Information 2012 [place], [place].

## 2011-09-17 NOTE — Progress Notes (Signed)
Patient is a 51 year old who last year had been placed on hormone replacement therapy consisting of estradiol 0.05 patch weekly with the addition of Prometrium 200 for 12 days of each month. Patient presented today for evaluation of her irregular bleeding. She stated started since she went on hormone replacement therapy. Patient states that she has had good compliance.  Ultrasound: Uterus measured 8.5 x 6.0 x 5.2 cm with endometrial stripe of 11.1 mm. Right ovary echo-free cyst measuring 15 x 14 mm negative color flow. Left ovary thickwalled cystic mass with internal low level echoes echogenic negative color flow measuring 20 x 18 x 19 mm negative fluid in the cul-de-sac. Son infusion histogram with no intracavitary defect  Patient was counseled and she underwent an endometrial biopsy of the cervix had been cleansed with Betadine solution. The uterus sounded to 7 cm and a Pipelle was introduced into the intrauterine cavity and sample of tissue was obtained and submitted for histological evaluation.  Assessment/plan: Irregular bleeding on hormone replacement therapy. We discussed several options. One option would be to continue on her low dose estrogen patch which she applies weekly and consider placing a Mirena IUD for his progesterone effect to help thin out the endometrial lining and cut down her bleeding. The second option would be to have her use the patch for 3 weeks out of the month and for the last 7 days take a progestational agent. And finally the third option would be to increase her estrogen patch to 1 that's twice weekly instead of weekly or increase her estrogen dose. We'll wait for the results of the biopsy. Patient will decide on the above-mentioned options which were explained to  her today in the office. We'll have the patient return to the office in 3 months for followup on the small ovarian cyst on her left ovary. (She denied any pain).

## 2011-12-09 ENCOUNTER — Other Ambulatory Visit: Payer: Self-pay | Admitting: Women's Health

## 2011-12-09 ENCOUNTER — Telehealth: Payer: Self-pay | Admitting: *Deleted

## 2011-12-09 DIAGNOSIS — Z79890 Hormone replacement therapy (postmenopausal): Secondary | ICD-10-CM

## 2011-12-09 MED ORDER — ESTRADIOL-NORETHINDRONE ACET 0.5-0.1 MG PO TABS: 1.0000 | ORAL_TABLET | Freq: Every day | ORAL | Status: DC

## 2011-12-09 NOTE — Telephone Encounter (Signed)
Telephone call, states continues to bleed after 12 days of Prometrium and during, states often has more bleeding than not during the month. Had a negative sonohysterogram with negative biopsy last month. Mirena IUD was offered states would prefer to try something different. States has felt much better on hormones would like to continue but is tired of bleeding. Will try a change to Activella 0.5/0.1 by mouth daily and stop the patch and Prometrium. Instructed to call if continued bleeding after one month. Annual in September will evaluate.

## 2011-12-09 NOTE — Telephone Encounter (Signed)
Pt would like you to call her after 4:00 pm today to speak with you about menopause questions and her office visit with JF. Call back # [phone].

## 2012-01-04 ENCOUNTER — Telehealth: Payer: Self-pay | Admitting: *Deleted

## 2012-01-04 ENCOUNTER — Other Ambulatory Visit: Payer: Self-pay | Admitting: *Deleted

## 2012-01-04 DIAGNOSIS — R42 Dizziness and giddiness: Secondary | ICD-10-CM

## 2012-01-04 DIAGNOSIS — R002 Palpitations: Secondary | ICD-10-CM

## 2012-01-04 NOTE — Telephone Encounter (Signed)
Left message for patient to call and schedule event monitor.  

## 2012-01-07 ENCOUNTER — Encounter (INDEPENDENT_AMBULATORY_CARE_PROVIDER_SITE_OTHER): Payer: BC Managed Care – PPO

## 2012-01-07 DIAGNOSIS — R002 Palpitations: Secondary | ICD-10-CM

## 2012-01-07 DIAGNOSIS — R42 Dizziness and giddiness: Secondary | ICD-10-CM

## 2012-02-10 ENCOUNTER — Ambulatory Visit (INDEPENDENT_AMBULATORY_CARE_PROVIDER_SITE_OTHER): Payer: BC Managed Care – PPO | Admitting: Women's Health

## 2012-02-10 ENCOUNTER — Encounter: Payer: Self-pay | Admitting: Women's Health

## 2012-02-10 VITALS — BP 108/62 | Ht 62.5 in | Wt 137.0 lb

## 2012-02-10 DIAGNOSIS — Z13220 Encounter for screening for lipoid disorders: Secondary | ICD-10-CM

## 2012-02-10 DIAGNOSIS — M899 Disorder of bone, unspecified: Secondary | ICD-10-CM

## 2012-02-10 DIAGNOSIS — Z833 Family history of diabetes mellitus: Secondary | ICD-10-CM

## 2012-02-10 DIAGNOSIS — Z01419 Encounter for gynecological examination (general) (routine) without abnormal findings: Secondary | ICD-10-CM

## 2012-02-10 DIAGNOSIS — Z79890 Hormone replacement therapy (postmenopausal): Secondary | ICD-10-CM

## 2012-02-10 DIAGNOSIS — M8580 Other specified disorders of bone density and structure, unspecified site: Secondary | ICD-10-CM | POA: Insufficient documentation

## 2012-02-10 DIAGNOSIS — E079 Disorder of thyroid, unspecified: Secondary | ICD-10-CM

## 2012-02-10 DIAGNOSIS — N83209 Unspecified ovarian cyst, unspecified side: Secondary | ICD-10-CM

## 2012-02-10 LAB — CBC WITH DIFFERENTIAL/PLATELET
HCT: 38.8 % (ref 36.0–46.0)
Hemoglobin: 13.2 g/dL (ref 12.0–15.0)
Lymphocytes Relative: 19 % (ref 12–46)
Lymphs Abs: 1.2 10*3/uL (ref 0.7–4.0)
Monocytes Absolute: 0.4 10*3/uL (ref 0.1–1.0)
Monocytes Relative: 6 % (ref 3–12)
Neutro Abs: 4.9 10*3/uL (ref 1.7–7.7)
Neutrophils Relative %: 74 % (ref 43–77)
RBC: 4 MIL/uL (ref 3.87–5.11)
WBC: 6.6 10*3/uL (ref 4.0–10.5)

## 2012-02-10 LAB — LIPID PANEL
Cholesterol: 247 mg/dL — ABNORMAL HIGH (ref 0–200)
Total CHOL/HDL Ratio: 3.6 Ratio
Triglycerides: 191 mg/dL — ABNORMAL HIGH (ref <150)
VLDL: 38 mg/dL (ref 0–40)

## 2012-02-10 LAB — GLUCOSE, RANDOM: Glucose, Bld: 85 mg/dL (ref 70–99)

## 2012-02-10 MED ORDER — ESTRADIOL-NORETHINDRONE ACET 0.5-0.1 MG PO TABS: 1.0000 | ORAL_TABLET | Freq: Every day | ORAL | Status: DC

## 2012-02-10 NOTE — Patient Instructions (Signed)

## 2012-02-10 NOTE — Progress Notes (Signed)
Tara Carpenter 04-13-1961 [id number]    History:    The patient presents for annual exam.  Postmenopausal on Activella 0.5/0.1 with no bleeding since 4/13. Had a negative sonohysterogram with negative biopsy 08/2011 for postmenopausal bleeding. History of GDM, chronic sinusitis and asthma. Had a negative colonoscopy 01/2010. History of LEEP 1991 with normal Paps after. Normal mammograms. Osteopenia T score -1.5 at left femoral neck 06/2011   Past medical history, past surgical history, family history and social history were all reviewed and documented in the EPIC chart. Mother died with colon cancer at age 50. Both parents diabetics. Father died at 50 with heart disease. Both sons in engineering 1 at [employer] and other at [employer] state, daughter Tobi Bastos 15 doing well.   ROS:  A  ROS was performed and pertinent positives and negatives are included in the history.  Exam:  Filed Vitals:   02/10/12 0820  BP: 108/62    General appearance:  Normal Head/Neck:  Normal, without cervical or supraclavicular adenopathy. Thyroid:  Symmetrical, normal in size, without palpable masses or nodularity. Respiratory  Effort:  Normal  Auscultation:  Clear without wheezing or rhonchi Cardiovascular  Auscultation:  Regular rate, without rubs, murmurs or gallops  Edema/varicosities:  Not grossly evident Abdominal  Soft,nontender, without masses, guarding or rebound.  Liver/spleen:  No organomegaly noted  Hernia:  None appreciated  Skin  Inspection:  Grossly normal  Palpation:  Grossly normal Neurologic/psychiatric  Orientation:  Normal with appropriate conversation.  Mood/affect:  Normal  Genitourinary    Breasts: Examined lying and sitting.     Right: Without masses, retractions, discharge or axillary adenopathy.     Left: Without masses, retractions, discharge or axillary adenopathy.   Inguinal/mons:  Normal without inguinal adenopathy  External genitalia:  Normal  BUS/Urethra/Skene's glands:   Normal  Bladder:  Normal  Vagina:  Normal  Cervix:  Normal  Uterus:  normal in size, shape and contour.  Midline and mobile  Adnexa/parametria:     Rt: Without masses or tenderness.   Lt: Without masses or tenderness.  Anus and perineum: Normal  Digital rectal exam: Normal sphincter tone without palpated masses or tenderness  Assessment/Plan:  51 y.o. MWF G4 3 P3 for annual exam with no complaints.  Postmenopausal exam on HRT with good symptom relief Osteopenia T score -1.5 femoral neck 06/2011 Asthma-primary care  Plan: Activella 0.5/0.1 prescription, proper use, slight risk for blood clots, strokes, breast cancer. SBE's, continue annual mammogram, calcium rich diet, vitamin D 2000 daily, continue regular exercise. CBC, glucose, lipid panel, TSH, UA. Hemoccult card given. No Pap history of normal Paps for 10 years. New screening guidelines reviewed.   Crystall Donaldson J WHNP, 1:10 PM 02/10/2012

## 2012-02-11 LAB — URINALYSIS W MICROSCOPIC + REFLEX CULTURE
Bacteria, UA: NONE SEEN
Bilirubin Urine: NEGATIVE
Crystals: NONE SEEN
Glucose, UA: NEGATIVE mg/dL
Ketones, ur: NEGATIVE mg/dL
Protein, ur: NEGATIVE mg/dL
Urobilinogen, UA: 0.2 mg/dL (ref 0.0–1.0)

## 2012-02-12 LAB — URINE CULTURE: Colony Count: 70000

## 2012-02-15 ENCOUNTER — Encounter: Payer: Self-pay | Admitting: Gynecology

## 2012-02-15 ENCOUNTER — Encounter: Payer: Self-pay | Admitting: Obstetrics and Gynecology

## 2012-04-14 ENCOUNTER — Telehealth: Payer: Self-pay | Admitting: Internal Medicine

## 2012-04-14 NOTE — Telephone Encounter (Signed)
Patient states she has had rectal bleeding x 2 this week. She thinks it is a hemorrhoid but she has never had one before. States she has seen blood in the toilet after urinating. The blood is bright, red. She is sure it is not from urine or vaginal. She denies blood in stool, in toilet water after BM or on tissue after BM. No pain. Scheduled patient with Mike Gip, PA on 04/18/12 at 3:30 PM. Colonoscopy- 02/20/10 adenomatous polyps

## 2012-04-14 NOTE — Telephone Encounter (Signed)
I can see her tomorrow Friday 11/22  at 1.00 pm

## 2012-04-15 ENCOUNTER — Encounter: Payer: Self-pay | Admitting: *Deleted

## 2012-04-15 NOTE — Telephone Encounter (Signed)
Left a message for patient to call me. 

## 2012-04-15 NOTE — Telephone Encounter (Signed)
Patient cannot come today due to her work schedule. She will keep appointment on Monday.

## 2012-04-18 ENCOUNTER — Encounter: Payer: Self-pay | Admitting: Physician Assistant

## 2012-04-18 ENCOUNTER — Ambulatory Visit (INDEPENDENT_AMBULATORY_CARE_PROVIDER_SITE_OTHER): Payer: BC Managed Care – PPO | Admitting: Physician Assistant

## 2012-04-18 VITALS — BP 130/76 | HR 60 | Ht 63.0 in | Wt 136.4 lb

## 2012-04-18 DIAGNOSIS — K625 Hemorrhage of anus and rectum: Secondary | ICD-10-CM

## 2012-04-18 DIAGNOSIS — Z800 Family history of malignant neoplasm of digestive organs: Secondary | ICD-10-CM | POA: Insufficient documentation

## 2012-04-18 DIAGNOSIS — Z86010 Personal history of colonic polyps: Secondary | ICD-10-CM

## 2012-04-18 MED ORDER — HYDROCORTISONE ACETATE 25 MG RE SUPP: RECTAL | Status: DC

## 2012-04-18 NOTE — Patient Instructions (Addendum)
We sent a precription for the Anusol HC Suppositories to [facility] [address].  Call us back if bleeding increases.

## 2012-04-18 NOTE — Progress Notes (Signed)
Subjective:    Patient ID: Tara Carpenter, female    DOB: [birth year], 50 y.o.   MRN: [id number]  HPI Tara Carpenter is very nice 51 year old white female known to Dr. Juanda Chance from prior screening colonoscopy done in September of 2011. She was found to have one adenomatous and one hyperplastic polyp both of which were removed, no hemorrhoids were noted. Patient does have family history of colon cancer in her mother who had been diagnosed with stage IV colon cancer around the time the patient had had her screening and has since passed Tara Carpenter comes in today because of mild volume hematochezia noted on 2 occasions last week. She says she had noticed a drop of blood in the commode and a drop of blood on the tissue when wiping after urinating. She did not have any anorectal discomfort or pain at that time, has not been constipated. She has not noted any blood mixed in with her bowel movements and has not seen any blood since those 2 occasions. She has no complaints of abdominal pain discomfort or change in her bowel habits and otherwise feels fine. She has not had any hemorrhoid symptoms in the past.    Review of Systems  Constitutional: Negative.   HENT: Negative.   Respiratory: Negative.   Cardiovascular: Negative.   Gastrointestinal: Positive for anal bleeding.  Genitourinary: Negative.   Musculoskeletal: Negative.   Neurological: Negative.   Hematological: Negative.   Psychiatric/Behavioral: Negative.    Outpatient Prescriptions Prior to Visit  Medication Sig Dispense Refill  . Calcium Carbonate-Vitamin D (CALCIUM + D PO) Take by mouth.        . Cetirizine HCl (ZYRTEC ALLERGY PO) Take by mouth daily.        . Estradiol-Norethindrone Acet 0.5-0.1 MG per tablet Take 1 tablet by mouth daily.  30 tablet  12  . Itraconazole (SPORANOX PO) Take 200 mg by mouth.        . Montelukast Sodium (SINGULAIR PO) Take by mouth.        . Multiple Vitamin (MULTIVITAMIN) capsule Take 1 capsule by mouth daily.        .  Omega-3 Fatty Acids (FISH OIL PO) Take 1 capsule by mouth.         Allergies  Allergen Reactions  . Amoxicillin-Pot Clavulanate Hives    Hives  . Aspirin     Aspirin sensitivity in the context of asthma and nasal polyps.       Patient Active Problem List  Diagnosis  . SINUSITIS, CHRONIC  . ASTHMA, UNSPECIFIED, UNSPECIFIED STATUS  . DIABETES MELLITUS, GESTATIONAL, HX OF  . Asthma  . Family history of ischemic heart disease  . Other and unspecified hyperlipidemia  . Osteopenia  . Family hx of colon cancer   History  Substance Use Topics  . Smoking status: Never Smoker   . Smokeless tobacco: Never Used  . Alcohol Use: 3.0 oz/week    6 drink(s) per week    Objective:   Physical Exam well-developed white female in no acute distress, pleasant blood pressure 130/76 pulse 60 height 5 foot 3 weight 136. HEENT; nontraumatic normocephalic EOMI PERRLA sclera anicteric, Neck; supple no JVD, Cardiovascular; regular rate and rhythm with S1-S2 no murmur or gallop, Pulmonary; clear bilaterally, Abdomen; soft nontender no palpable mass or hepatosplenomegaly bowel sounds are active, Rectal; no external lesion or tear noted, on anoscopy she does have internal hemorrhoid which is slightly friable, soft nontender nonthrombosed. Extremities; no clubbing cyanosis or edema skin warm and dry,  Psych; mood and affect normal and appropriate.        Assessment & Plan:  #27 51 year old female with very small volume hematochezia very likely secondary to internal hemorrhoid noted on anoscopy today. #2 history of adenomatous and hyperplastic polyps-up-to-date with colonoscopy due for followup in 2016. #3 family history of colon cancer the patient's mother  Plan; Anusol-HC suppositories at bedtime for 7-10 days and then as needed. She is advised that should she have any recurrence of bleeding increased in volume of bleeding etc. that she should contact us for reevaluation. Plan followup colonoscopy in 2016

## 2012-04-18 NOTE — Progress Notes (Signed)
Reviewed, I agree with the assessment of small volume hematochezia, treat with Anusol HC supp. X 10 days

## 2012-06-01 ENCOUNTER — Encounter: Payer: Self-pay | Admitting: Women's Health

## 2012-07-09 ENCOUNTER — Other Ambulatory Visit: Payer: Self-pay

## 2012-08-24 ENCOUNTER — Other Ambulatory Visit: Payer: Self-pay | Admitting: *Deleted

## 2012-08-24 DIAGNOSIS — Z8249 Family history of ischemic heart disease and other diseases of the circulatory system: Secondary | ICD-10-CM

## 2012-09-08 ENCOUNTER — Ambulatory Visit (INDEPENDENT_AMBULATORY_CARE_PROVIDER_SITE_OTHER): Payer: Self-pay

## 2012-09-08 DIAGNOSIS — Z8249 Family history of ischemic heart disease and other diseases of the circulatory system: Secondary | ICD-10-CM

## 2012-09-08 DIAGNOSIS — Z0000 Encounter for general adult medical examination without abnormal findings: Secondary | ICD-10-CM

## 2013-02-15 ENCOUNTER — Encounter: Payer: Self-pay | Admitting: Women's Health

## 2013-02-15 ENCOUNTER — Ambulatory Visit (INDEPENDENT_AMBULATORY_CARE_PROVIDER_SITE_OTHER): Payer: BC Managed Care – PPO | Admitting: Women's Health

## 2013-02-15 ENCOUNTER — Other Ambulatory Visit (HOSPITAL_COMMUNITY)
Admission: RE | Admit: 2013-02-15 | Discharge: 2013-02-15 | Disposition: A | Discharge status: Home / Self Care | Payer: BC Managed Care – PPO | Source: Ambulatory Visit | Attending: Gynecology | Admitting: Gynecology

## 2013-02-15 VITALS — BP 124/76 | Ht 62.5 in | Wt 136.0 lb

## 2013-02-15 DIAGNOSIS — Z79890 Hormone replacement therapy (postmenopausal): Secondary | ICD-10-CM

## 2013-02-15 DIAGNOSIS — Z01419 Encounter for gynecological examination (general) (routine) without abnormal findings: Secondary | ICD-10-CM

## 2013-02-15 DIAGNOSIS — Z833 Family history of diabetes mellitus: Secondary | ICD-10-CM

## 2013-02-15 DIAGNOSIS — Z13220 Encounter for screening for lipoid disorders: Secondary | ICD-10-CM

## 2013-02-15 DIAGNOSIS — Z23 Encounter for immunization: Secondary | ICD-10-CM

## 2013-02-15 LAB — CBC WITH DIFFERENTIAL/PLATELET
Basophils Absolute: 0 10*3/uL (ref 0.0–0.1)
Basophils Relative: 1 % (ref 0–1)
Eosinophils Absolute: 0.2 10*3/uL (ref 0.0–0.7)
Eosinophils Relative: 4 % (ref 0–5)
Lymphs Abs: 1.1 10*3/uL (ref 0.7–4.0)
MCH: 33.1 pg (ref 26.0–34.0)
MCHC: 34 g/dL (ref 30.0–36.0)
MCV: 97.2 fL (ref 78.0–100.0)
Neutro Abs: 3.4 10*3/uL (ref 1.7–7.7)
Neutrophils Relative %: 66 % (ref 43–77)
Platelets: 254 10*3/uL (ref 150–400)
RDW: 13.1 % (ref 11.5–15.5)

## 2013-02-15 LAB — GLUCOSE, RANDOM: Glucose, Bld: 79 mg/dL (ref 70–99)

## 2013-02-15 LAB — LIPID PANEL
HDL: 63 mg/dL (ref >39)
LDL Cholesterol: 134 mg/dL — ABNORMAL HIGH (ref 0–99)
Triglycerides: 174 mg/dL — ABNORMAL HIGH (ref <150)

## 2013-02-15 MED ORDER — ESTRADIOL-NORETHINDRONE ACET 0.5-0.1 MG PO TABS: 1.0000 | ORAL_TABLET | Freq: Every day | ORAL | Status: DC

## 2013-02-15 NOTE — Patient Instructions (Signed)
Health Recommendations for Postmenopausal Women Respected and ongoing research has looked at the most common causes of death, disability, and poor quality of life in postmenopausal women. The causes include heart disease, diseases of blood vessels, diabetes, depression, cancer, and bone loss (osteoporosis). Many things can be done to help lower the chances of developing these and other common problems: CARDIOVASCULAR DISEASE Heart Disease: A heart attack is a medical emergency. Know the signs and symptoms of a heart attack. Below are things women can do to reduce their risk for heart disease.   Do not smoke. If you smoke, quit.  Aim for a healthy weight. Being overweight causes many preventable deaths. Eat a healthy and balanced diet and drink an adequate amount of liquids.  Get moving. Make a commitment to be more physically active. Aim for 30 minutes of activity on most, if not all days of the week.  Eat for heart health. Choose a diet that is low in saturated fat and cholesterol and eliminate trans fat. Include whole grains, vegetables, and fruits. Read and understand the labels on food containers before buying.  Know your numbers. Ask your caregiver to check your blood pressure, cholesterol (total, HDL, LDL, triglycerides) and blood glucose. Work with your caregiver on improving your entire clinical picture.  High blood pressure. Limit or stop your table salt intake (try salt substitute and food seasonings). Avoid salty foods and drinks. Read labels on food containers before buying. Eating well and exercising can help control high blood pressure. STROKE  Stroke is a medical emergency. Stroke may be the result of a blood clot in a blood vessel in the brain or by a brain hemorrhage (bleeding). Know the signs and symptoms of a stroke. To lower the risk of developing a stroke:  Avoid fatty foods.  Quit smoking.  Control your diabetes, blood pressure, and irregular heart rate. THROMBOPHLEBITIS  (BLOOD CLOT) OF THE LEG  Becoming overweight and leading a stationary lifestyle may also contribute to developing blood clots. Controlling your diet and exercising will help lower the risk of developing blood clots. CANCER SCREENING  Breast Cancer: Take steps to reduce your risk of breast cancer.  You should practice "breast self-awareness." This means understanding the normal appearance and feel of your breasts and should include breast self-examination. Any changes detected, no matter how small, should be reported to your caregiver.  After age 40, you should have a clinical breast exam (CBE) every year.  Starting at age 40, you should consider having a mammogram (breast X-ray) every year.  If you have a family history of breast cancer, talk to your caregiver about genetic screening.  If you are at high risk for breast cancer, talk to your caregiver about having an MRI and a mammogram every year.  Intestinal or Stomach Cancer: Tests to consider are a rectal exam, fecal occult blood, sigmoidoscopy, and colonoscopy. Women who are high risk may need to be screened at an earlier age and more often.  Cervical Cancer:  Beginning at age 30, you should have a Pap test every 3 years as long as the past 3 Pap tests have been normal.  If you have had past treatment for cervical cancer or a condition that could lead to cancer, you need Pap tests and screening for cancer for at least 20 years after your treatment.  If you had a hysterectomy for a problem that was not cancer or a condition that could lead to cancer, then you no longer need Pap tests.    If you are between ages 65 and 70, and you have had normal Pap tests going back 10 years, you no longer need Pap tests.  If Pap tests have been discontinued, risk factors (such as a new sexual partner) need to be reassessed to determine if screening should be resumed.  Some medical problems can increase the chance of getting cervical cancer. In these  cases, your caregiver may recommend more frequent screening and Pap tests.  Uterine Cancer: If you have vaginal bleeding after reaching menopause, you should notify your caregiver.  Ovarian cancer: Other than yearly pelvic exams, there are no reliable tests available to screen for ovarian cancer at this time except for yearly pelvic exams.  Lung Cancer: Yearly chest X-rays can detect lung cancer and should be done on high risk women, such as cigarette smokers and women with chronic lung disease (emphysema).  Skin Cancer: A complete body skin exam should be done at your yearly examination. Avoid overexposure to the sun and ultraviolet light lamps. Use a strong sun block cream when in the sun. All of these things are important in lowering the risk of skin cancer. MENOPAUSE Menopause Symptoms: Hormone therapy products are effective for treating symptoms associated with menopause:  Moderate to severe hot flashes.  Night sweats.  Mood swings.  Headaches.  Tiredness.  Loss of sex drive.  Insomnia.  Other symptoms. Hormone replacement carries certain risks, especially in older women. Women who use or are thinking about using estrogen or estrogen with progestin treatments should discuss that with their caregiver. Your caregiver will help you understand the benefits and risks. The ideal dose of hormone replacement therapy is not known. The Food and Drug Administration (FDA) has concluded that hormone therapy should be used only at the lowest doses and for the shortest amount of time to reach treatment goals.  OSTEOPOROSIS Protecting Against Bone Loss and Preventing Fracture: If you use hormone therapy for prevention of bone loss (osteoporosis), the risks for bone loss must outweigh the risk of the therapy. Ask your caregiver about other medications known to be safe and effective for preventing bone loss and fractures. To guard against bone loss or fractures, the following is recommended:  If  you are less than age 50, take 1000 mg of calcium and at least 600 mg of Vitamin D per day.  If you are greater than age 50 but less than age 70, take 1200 mg of calcium and at least 600 mg of Vitamin D per day.  If you are greater than age 70, take 1200 mg of calcium and at least 800 mg of Vitamin D per day. Smoking and excessive alcohol intake increases the risk of osteoporosis. Eat foods rich in calcium and vitamin D and do weight bearing exercises several times a week as your caregiver suggests. DIABETES Diabetes Melitus: If you have Type I or Type 2 diabetes, you should keep your blood sugar under control with diet, exercise and recommended medication. Avoid too many sweets, starchy and fatty foods. Being overweight can make control more difficult. COGNITION AND MEMORY Cognition and Memory: Menopausal hormone therapy is not recommended for the prevention of cognitive disorders such as Alzheimer's disease or memory loss.  DEPRESSION  Depression may occur at any age, but is common in elderly women. The reasons may be because of physical, medical, social (loneliness), or financial problems and needs. If you are experiencing depression because of medical problems and control of symptoms, talk to your caregiver about this. Physical activity and   exercise may help with mood and sleep. Community and volunteer involvement may help your sense of value and worth. If you have depression and you feel that the problem is getting worse or becoming severe, talk to your caregiver about treatment options that are best for you. ACCIDENTS  Accidents are common and can be serious in the elderly woman. Prepare your house to prevent accidents. Eliminate throw rugs, place hand bars in the bath, shower and toilet areas. Avoid wearing high heeled shoes or walking on wet, snowy, and icy areas. Limit or stop driving if you have vision or hearing problems, or you feel you are unsteady with you movements and  reflexes. HEPATITIS C Hepatitis C is a type of viral infection affecting the liver. It is spread mainly through contact with blood from an infected person. It can be treated, but if left untreated, it can lead to severe liver damage over years. Many people who are infected do not know that the virus is in their blood. If you are a "baby-boomer", it is recommended that you have one screening test for Hepatitis C. IMMUNIZATIONS  Several immunizations are important to consider having during your senior years, including:   Tetanus, diptheria, and pertussis booster shot.  Influenza every year before the flu season begins.  Pneumonia vaccine.  Shingles vaccine.  Others as indicated based on your specific needs. Talk to your caregiver about these. Document Released: 07/03/2005 Document Revised: 04/27/2012 Document Reviewed: 02/27/2008 ExitCare Patient Information 2014 [employer].  

## 2013-02-15 NOTE — Progress Notes (Signed)
Tara Carpenter 17-Sep-1960 [id number] History:    The patient presents for annual exam. Postmenopausal/Activella .5/.1/no bleeding. Normal Pap and mammogram history. 2013 DEXA T score of -1.5 at right hip, FRAX 6.8%/.7%. 2013 Negative sonohysterogram with biopsy for postmenopausal bleeding.  2011 negative colonoscopy, mother died of colon cancer at age 51. States menopause symptoms have improved, would like to start weaning off HRT. History of LEEP in the 90s with normal Paps after.  Past medical history, past surgical history, family history and social history were all reviewed and documented in the EPIC chart. Asthma, chronic sinusitis, both better. History of gestational diabetes. Mother: Colon cancer age 46. Father: Diabetes, heart disease. 3 children, son senior at [employer], son at [employer] state, daughter 11th grade.   ROS:  A  ROS was performed and pertinent positives and negatives are included in the history.  Exam:  Filed Vitals:   02/15/13 0808  BP: 124/76    General appearance:  Normal Head/Neck:  Normal, without cervical or supraclavicular adenopathy. Thyroid:  Symmetrical, normal in size, without palpable masses or nodularity. Respiratory  Effort:  Normal  Auscultation:  Clear without wheezing or rhonchi Cardiovascular  Auscultation:  Regular rate, without rubs, murmurs or gallops  Edema/varicosities:  Not grossly evident Abdominal  Soft,nontender, without masses, guarding or rebound.  Liver/spleen:  No organomegaly noted  Hernia:  None appreciated  Skin  Inspection:  Grossly normal  Palpation:  Grossly normal Neurologic/psychiatric  Orientation:  Normal with appropriate conversation.  Mood/affect:  Normal  Genitourinary    Breasts: Examined lying and sitting.     Right: Without masses, retractions, discharge or axillary adenopathy.     Left: Without masses, retractions, discharge or axillary adenopathy.   Inguinal/mons:  Normal without inguinal adenopathy  External  genitalia:  Normal  BUS/Urethra/Skene's glands:  Normal  Bladder:  Normal  Vagina:  Postmenopausal  Cervix:  Normal, status post LEEP procedure  Uterus:  normal in size, shape and contour.  Midline and mobile  Adnexa/parametria:     Rt: Without masses or tenderness.   Lt: Without masses or tenderness.  Anus and perineum: Normal  Digital rectal exam: Normal sphincter tone without palpated masses or tenderness  Assessment/Plan:  52 y.o.  G4P3 MWF for annual exam.     Postmenopausal GYN exam/HRT/no bleeding/good relief of menopausal symptoms Osteopenia, T score of -1.5 at right hip Influenza vaccine History of asthma with sinus problems  Plan: Pap, normal Pap 2012, new screening guidelines reviewed, CBC, lipid panel, UA, glucose. Continue annual mammogram, 3D tomography encouraged due to dense breasts, SBE's, MVI, calcium rich diet/vitamin D 2000 daily, continue regular exercise encouraged. Activella 0.5/0.1 prescription, proper use, slight risk for blood clots strokes and breast cancer reviewed. Will start to wean off if chooses this year or continue for one more year.  Harrington Challenger [facility], 8:45 AM 02/15/2013

## 2013-02-16 LAB — URINALYSIS W MICROSCOPIC + REFLEX CULTURE
Bacteria, UA: NONE SEEN
Bilirubin Urine: NEGATIVE
Crystals: NONE SEEN
Glucose, UA: NEGATIVE mg/dL
Ketones, ur: NEGATIVE mg/dL
Protein, ur: NEGATIVE mg/dL
Urobilinogen, UA: 0.2 mg/dL (ref 0.0–1.0)

## 2013-02-17 LAB — URINE CULTURE: Colony Count: 15000

## 2013-02-21 ENCOUNTER — Other Ambulatory Visit: Payer: Self-pay | Admitting: Gynecology

## 2013-06-16 ENCOUNTER — Encounter: Payer: Self-pay | Admitting: Women's Health

## 2014-02-16 ENCOUNTER — Ambulatory Visit (INDEPENDENT_AMBULATORY_CARE_PROVIDER_SITE_OTHER): Payer: BC Managed Care – PPO | Admitting: Women's Health

## 2014-02-16 ENCOUNTER — Other Ambulatory Visit (HOSPITAL_COMMUNITY)
Admission: RE | Admit: 2014-02-16 | Discharge: 2014-02-16 | Disposition: A | Discharge status: Home / Self Care | Payer: BC Managed Care – PPO | Source: Ambulatory Visit | Attending: Women's Health | Admitting: Women's Health

## 2014-02-16 ENCOUNTER — Encounter: Payer: Self-pay | Admitting: Women's Health

## 2014-02-16 VITALS — BP 120/82 | Ht 62.0 in | Wt 136.0 lb

## 2014-02-16 DIAGNOSIS — Z01419 Encounter for gynecological examination (general) (routine) without abnormal findings: Secondary | ICD-10-CM | POA: Insufficient documentation

## 2014-02-16 DIAGNOSIS — Z79890 Hormone replacement therapy (postmenopausal): Secondary | ICD-10-CM

## 2014-02-16 DIAGNOSIS — Z23 Encounter for immunization: Secondary | ICD-10-CM

## 2014-02-16 DIAGNOSIS — Z13220 Encounter for screening for lipoid disorders: Secondary | ICD-10-CM

## 2014-02-16 DIAGNOSIS — Z1151 Encounter for screening for human papillomavirus (HPV): Secondary | ICD-10-CM | POA: Diagnosis present

## 2014-02-16 LAB — COMPREHENSIVE METABOLIC PANEL
ALT: 11 U/L (ref 0–35)
AST: 14 U/L (ref 0–37)
Albumin: 4 g/dL (ref 3.5–5.2)
Alkaline Phosphatase: 35 U/L — ABNORMAL LOW (ref 39–117)
BUN: 13 mg/dL (ref 6–23)
CALCIUM: 9.1 mg/dL (ref 8.4–10.5)
CHLORIDE: 104 meq/L (ref 96–112)
CO2: 26 meq/L (ref 19–32)
CREATININE: 0.79 mg/dL (ref 0.50–1.10)
Glucose, Bld: 100 mg/dL — ABNORMAL HIGH (ref 70–99)
Potassium: 4.2 mEq/L (ref 3.5–5.3)
Sodium: 138 mEq/L (ref 135–145)
Total Bilirubin: 0.5 mg/dL (ref 0.2–1.2)
Total Protein: 6.5 g/dL (ref 6.0–8.3)

## 2014-02-16 LAB — LIPID PANEL
Cholesterol: 249 mg/dL — ABNORMAL HIGH (ref 0–200)
HDL: 72 mg/dL (ref >39)
LDL Cholesterol: 135 mg/dL — ABNORMAL HIGH (ref 0–99)
Total CHOL/HDL Ratio: 3.5 Ratio
Triglycerides: 210 mg/dL — ABNORMAL HIGH (ref <150)
VLDL: 42 mg/dL — ABNORMAL HIGH (ref 0–40)

## 2014-02-16 LAB — CBC WITH DIFFERENTIAL/PLATELET
Basophils Absolute: 0.1 10*3/uL (ref 0.0–0.1)
Basophils Relative: 1 % (ref 0–1)
EOS PCT: 2 % (ref 0–5)
Eosinophils Absolute: 0.1 10*3/uL (ref 0.0–0.7)
HEMATOCRIT: 37.8 % (ref 36.0–46.0)
HEMOGLOBIN: 12.9 g/dL (ref 12.0–15.0)
LYMPHS ABS: 0.9 10*3/uL (ref 0.7–4.0)
LYMPHS PCT: 15 % (ref 12–46)
MCH: 32.2 pg (ref 26.0–34.0)
MCHC: 34.1 g/dL (ref 30.0–36.0)
MCV: 94.3 fL (ref 78.0–100.0)
MONO ABS: 0.3 10*3/uL (ref 0.1–1.0)
MONOS PCT: 5 % (ref 3–12)
NEUTROS ABS: 4.6 10*3/uL (ref 1.7–7.7)
Neutrophils Relative %: 77 % (ref 43–77)
Platelets: 267 10*3/uL (ref 150–400)
RBC: 4.01 MIL/uL (ref 3.87–5.11)
RDW: 12.5 % (ref 11.5–15.5)
WBC: 6 10*3/uL (ref 4.0–10.5)

## 2014-02-16 LAB — URINALYSIS W MICROSCOPIC + REFLEX CULTURE
Bacteria, UA: NONE SEEN
Bilirubin Urine: NEGATIVE
CRYSTALS: NONE SEEN
Casts: NONE SEEN
GLUCOSE, UA: NEGATIVE mg/dL
Hgb urine dipstick: NEGATIVE
Ketones, ur: NEGATIVE mg/dL
LEUKOCYTES UA: NEGATIVE
Nitrite: NEGATIVE
PROTEIN: NEGATIVE mg/dL
Specific Gravity, Urine: 1.011 (ref 1.005–1.030)
Squamous Epithelial / LPF: NONE SEEN
UROBILINOGEN UA: 0.2 mg/dL (ref 0.0–1.0)
pH: 7.5 (ref 5.0–8.0)

## 2014-02-16 MED ORDER — ESTRADIOL-NORETHINDRONE ACET 0.5-0.1 MG PO TABS: 1.0000 | ORAL_TABLET | Freq: Every day | ORAL | Status: DC

## 2014-02-16 NOTE — Patient Instructions (Signed)
Health Recommendations for Postmenopausal Women Respected and ongoing research has looked at the most common causes of death, disability, and poor quality of life in postmenopausal women. The causes include heart disease, diseases of blood vessels, diabetes, depression, cancer, and bone loss (osteoporosis). Many things can be done to help lower the chances of developing these and other common problems. CARDIOVASCULAR DISEASE Heart Disease: A heart attack is a medical emergency. Know the signs and symptoms of a heart attack. Below are things women can do to reduce their risk for heart disease.   Do not smoke. If you smoke, quit.  Aim for a healthy weight. Being overweight causes many preventable deaths. Eat a healthy and balanced diet and drink an adequate amount of liquids.  Get moving. Make a commitment to be more physically active. Aim for 30 minutes of activity on most, if not all days of the week.  Eat for heart health. Choose a diet that is low in saturated fat and cholesterol and eliminate trans fat. Include whole grains, vegetables, and fruits. Read and understand the labels on food containers before buying.  Know your numbers. Ask your caregiver to check your blood pressure, cholesterol (total, HDL, LDL, triglycerides) and blood glucose. Work with your caregiver on improving your entire clinical picture.  High blood pressure. Limit or stop your table salt intake (try salt substitute and food seasonings). Avoid salty foods and drinks. Read labels on food containers before buying. Eating well and exercising can help control high blood pressure. STROKE  Stroke is a medical emergency. Stroke may be the result of a blood clot in a blood vessel in the brain or by a brain hemorrhage (bleeding). Know the signs and symptoms of a stroke. To lower the risk of developing a stroke:  Avoid fatty foods.  Quit smoking.  Control your diabetes, blood pressure, and irregular heart rate. THROMBOPHLEBITIS  (BLOOD CLOT) OF THE LEG  Becoming overweight and leading a stationary lifestyle may also contribute to developing blood clots. Controlling your diet and exercising will help lower the risk of developing blood clots. CANCER SCREENING  Breast Cancer: Take steps to reduce your risk of breast cancer.  You should practice "breast self-awareness." This means understanding the normal appearance and feel of your breasts and should include breast self-examination. Any changes detected, no matter how small, should be reported to your caregiver.  After age 40, you should have a clinical breast exam (CBE) every year.  Starting at age 40, you should consider having a mammogram (breast X-ray) every year.  If you have a family history of breast cancer, talk to your caregiver about genetic screening.  If you are at high risk for breast cancer, talk to your caregiver about having an MRI and a mammogram every year.  Intestinal or Stomach Cancer: Tests to consider are a rectal exam, fecal occult blood, sigmoidoscopy, and colonoscopy. Women who are high risk may need to be screened at an earlier age and more often.  Cervical Cancer:  Beginning at age 30, you should have a Pap test every 3 years as long as the past 3 Pap tests have been normal.  If you have had past treatment for cervical cancer or a condition that could lead to cancer, you need Pap tests and screening for cancer for at least 20 years after your treatment.  If you had a hysterectomy for a problem that was not cancer or a condition that could lead to cancer, then you no longer need Pap tests.    If you are between ages 65 and 70, and you have had normal Pap tests going back 10 years, you no longer need Pap tests.  If Pap tests have been discontinued, risk factors (such as a new sexual partner) need to be reassessed to determine if screening should be resumed.  Some medical problems can increase the chance of getting cervical cancer. In these  cases, your caregiver may recommend more frequent screening and Pap tests.  Uterine Cancer: If you have vaginal bleeding after reaching menopause, you should notify your caregiver.  Ovarian Cancer: Other than yearly pelvic exams, there are no reliable tests available to screen for ovarian cancer at this time except for yearly pelvic exams.  Lung Cancer: Yearly chest X-rays can detect lung cancer and should be done on high risk women, such as cigarette smokers and women with chronic lung disease (emphysema).  Skin Cancer: A complete body skin exam should be done at your yearly examination. Avoid overexposure to the sun and ultraviolet light lamps. Use a strong sun block cream when in the sun. All of these things are important for lowering the risk of skin cancer. MENOPAUSE Menopause Symptoms: Hormone therapy products are effective for treating symptoms associated with menopause:  Moderate to severe hot flashes.  Night sweats.  Mood swings.  Headaches.  Tiredness.  Loss of sex drive.  Insomnia.  Other symptoms. Hormone replacement carries certain risks, especially in older women. Women who use or are thinking about using estrogen or estrogen with progestin treatments should discuss that with their caregiver. Your caregiver will help you understand the benefits and risks. The ideal dose of hormone replacement therapy is not known. The Food and Drug Administration (FDA) has concluded that hormone therapy should be used only at the lowest doses and for the shortest amount of time to reach treatment goals.  OSTEOPOROSIS Protecting Against Bone Loss and Preventing Fracture If you use hormone therapy for prevention of bone loss (osteoporosis), the risks for bone loss must outweigh the risk of the therapy. Ask your caregiver about other medications known to be safe and effective for preventing bone loss and fractures. To guard against bone loss or fractures, the following is recommended:  If  you are younger than age 50, take 1000 mg of calcium and at least 600 mg of Vitamin D per day.  If you are older than age 50 but younger than age 70, take 1200 mg of calcium and at least 600 mg of Vitamin D per day.  If you are older than age 70, take 1200 mg of calcium and at least 800 mg of Vitamin D per day. Smoking and excessive alcohol intake increases the risk of osteoporosis. Eat foods rich in calcium and vitamin D and do weight bearing exercises several times a week as your caregiver suggests. DIABETES Diabetes Mellitus: If you have type I or type 2 diabetes, you should keep your blood sugar under control with diet, exercise, and recommended medication. Avoid starchy and fatty foods, and too many sweets. Being overweight can make diabetes control more difficult. COGNITION AND MEMORY Cognition and Memory: Menopausal hormone therapy is not recommended for the prevention of cognitive disorders such as Alzheimer's disease or memory loss.  DEPRESSION  Depression may occur at any age, but it is common in elderly women. This may be because of physical, medical, social (loneliness), or financial problems and needs. If you are experiencing depression because of medical problems and control of symptoms, talk to your caregiver about this. Physical   activity and exercise may help with mood and sleep. Community and volunteer involvement may improve your sense of value and worth. If you have depression and you feel that the problem is getting worse or becoming severe, talk to your caregiver about which treatment options are best for you. ACCIDENTS  Accidents are common and can be serious in elderly woman. Prepare your house to prevent accidents. Eliminate throw rugs, place hand bars in bath, shower, and toilet areas. Avoid wearing high heeled shoes or walking on wet, snowy, and icy areas. Limit or stop driving if you have vision or hearing problems, or if you feel you are unsteady with your movements and  reflexes. HEPATITIS C Hepatitis C is a type of viral infection affecting the liver. It is spread mainly through contact with blood from an infected person. It can be treated, but if left untreated, it can lead to severe liver damage over the years. Many people who are infected do not know that the virus is in their blood. If you are a "baby-boomer", it is recommended that you have one screening test for Hepatitis C. IMMUNIZATIONS  Several immunizations are important to consider having during your senior years, including:   Tetanus, diphtheria, and pertussis booster shot.  Influenza every year before the flu season begins.  Pneumonia vaccine.  Shingles vaccine.  Others, as indicated based on your specific needs. Talk to your caregiver about these. Document Released: 07/03/2005 Document Revised: 09/25/2013 Document Reviewed: 02/27/2008 ExitCare Patient Information 2015 [employer]. This information is not intended to replace advice given to you by your health care provider. Make sure you discuss any questions you have with your health care provider.  

## 2014-02-16 NOTE — Progress Notes (Signed)
Tara Carpenter 1961-04-23 [id number]    History:    Presents for annual exam.  Postmenopausal on HRT with no bleeding. 2013 DEXA T score -1.5 at the right hip FRAX6.8%/>7%. 1990s  LEEP with normal Paps after. Normal mammogram history. Problems with asthma and chronic sinusitis primary care manages. History of GDM.  Past medical history, past surgical history, family history and social history were all reviewed and documented in the EPIC chart. Works part-time in a [employer], technical sales. Mother died of colon cancer date 33 history of negative colonoscopy less than 10 years ago. Oldest son graduated from [employer] working in [place], second son at [employer] state, daughter senior in high school, wants to go to [employer] state.  ROS:  A  12 point ROS was performed and pertinent positives and negatives are included.  Exam:  Filed Vitals:   02/16/14 0830  BP: 120/82    General appearance:  Normal Thyroid:  Symmetrical, normal in size, without palpable masses or nodularity. Respiratory  Auscultation:  Clear without wheezing or rhonchi Cardiovascular  Auscultation:  Regular rate, without rubs, murmurs or gallops  Edema/varicosities:  Not grossly evident Abdominal  Soft,nontender, without masses, guarding or rebound.  Liver/spleen:  No organomegaly noted  Hernia:  None appreciated  Skin  Inspection:  Grossly normal   Breasts: Examined lying and sitting.     Right: Without masses, retractions, discharge or axillary adenopathy.     Left: Without masses, retractions, discharge or axillary adenopathy. Gentitourinary   Inguinal/mons:  Normal without inguinal adenopathy  External genitalia:  Normal  BUS/Urethra/Skene's glands:  Normal  Vagina:  Normal  Cervix:  Normal  Uterus:   normal in size, shape and contour.  Midline and mobile  Adnexa/parametria:     Rt: Without masses or tenderness.   Lt: Without masses or tenderness.  Anus and perineum: Normal  Digital rectal exam: Normal sphincter tone without palpated  masses or tenderness  Assessment/Plan:  53 y.o. MWF G3P3 for annual exam with no complaints.  Postmenopausal on HRT with no bleeding Osteopenia   LEEP in the 90s with normal Paps after Asthma/chronic sinusitis-primary care manages  Plan: Repeat DEXA, home safety and fall prevention and importance of regular exercise discussed. Activella 0.5/.1 prescription, proper use given and reviewed slight risk for blood clots, strokes, breast cancer. Will try to wean off next year. SBE's, continue annual screening mammogram with 3-D tomography history of dense breast. CBC, lipid panel, comprehensive metabolic panel, vitamin D, UA, Pap with high-risk HPV typing, new screening guidelines reviewed.   Huel Cote [facility], 9:27 AM 02/16/2014

## 2014-02-17 LAB — VITAMIN D 25 HYDROXY (VIT D DEFICIENCY, FRACTURES): VIT D 25 HYDROXY: 60 ng/mL (ref 30–89)

## 2014-02-19 ENCOUNTER — Other Ambulatory Visit: Payer: Self-pay | Admitting: Women's Health

## 2014-02-19 DIAGNOSIS — E7800 Pure hypercholesterolemia, unspecified: Secondary | ICD-10-CM

## 2014-02-19 LAB — CYTOLOGY - PAP

## 2014-02-25 ENCOUNTER — Other Ambulatory Visit: Payer: Self-pay | Admitting: Women's Health

## 2014-03-26 ENCOUNTER — Encounter: Payer: Self-pay | Admitting: Women's Health

## 2014-05-14 ENCOUNTER — Encounter: Payer: Self-pay | Admitting: Internal Medicine

## 2014-06-06 ENCOUNTER — Encounter: Payer: Self-pay | Admitting: Internal Medicine

## 2014-06-06 ENCOUNTER — Ambulatory Visit (INDEPENDENT_AMBULATORY_CARE_PROVIDER_SITE_OTHER): Payer: BLUE CROSS/BLUE SHIELD | Admitting: Internal Medicine

## 2014-06-06 VITALS — BP 130/68 | HR 71 | Temp 98.0°F | Ht 62.0 in | Wt 142.0 lb

## 2014-06-06 DIAGNOSIS — J4521 Mild intermittent asthma with (acute) exacerbation: Secondary | ICD-10-CM

## 2014-06-06 MED ORDER — BUDESONIDE-FORMOTEROL FUMARATE 160-4.5 MCG/ACT IN AERO: 2.0000 | INHALATION_SPRAY | Freq: Two times a day (BID) | RESPIRATORY_TRACT | Status: DC

## 2014-06-06 NOTE — Progress Notes (Signed)
Pre visit review using our clinic review tool, if applicable. No additional management support is needed unless otherwise documented below in the visit note. 

## 2014-06-06 NOTE — Progress Notes (Signed)
   Subjective:    Patient ID: Tara Carpenter, female    DOB: [birth year], 54 y.o.   MRN: [id number]  HPI  She has had intermittent flares of her asthma; there is an exercise-induced bronchospasm component.  After Thanksgiving she took prednisone 40 mg daily for 7 days.  She does have Flovent which she's been using once daily. She also has been using albuterol 2 times a week since a flare before Christmas. She has been awakened on occasion by cough but denies significant nocturnal dyspnea.  The most recent exacerbaton  was initiated by a "cold".She had scant nasal discolored secretions.  She does have nasal polyps and is followed at [facility]. The nasal polyps have been treated with antifungal agent orally.  She remains on montelukast..  She does not have other upper respiratory tract infection symptoms.    Review of Systems Frontal headache, facial pain ,  dental pain, sore throat , otic pain or otic discharge denied. No fever , chills or sweats.    Objective:   Physical Exam  General appearance:good health ;well nourished; no acute distress or increased work of breathing is present.  No  lymphadenopathy about the head, neck, or axilla noted.   Eyes: No conjunctival inflammation or lid edema is present. There is no scleral icterus.  Ears:  External ear exam shows no significant lesions or deformities.  Otoscopic examination reveals clear canals, tympanic membranes are intact bilaterally without bulging, retraction, inflammation or discharge.  Nose:  External nasal examination shows no deformity or inflammation. Nasal mucosa are pink and moist without lesions or exudates. No septal dislocation or deviation.No obstruction to airflow.   Oral exam: Dental hygiene is good; lips and gums are healthy appearing.There is no oropharyngeal erythema or exudate noted.   Neck:  No deformities, thyromegaly, masses, or tenderness noted.   Supple with full range of motion without pain.    Heart:  Normal rate and regular rhythm. S1 and S2 normal without gallop, murmur, click, rub or other extra sounds.   Lungs:Chest clear to auscultation; no wheezes, rhonchi,rales ,or rubs present.No increased work of breathing.    Extremities:  No cyanosis, edema, or clubbing  noted    Skin: Warm & dry w/o jaundice or tenting.       Assessment & Plan:  #1 mild intermittent asthma with recent exacerbation  #2 nasal polyps  Plan: See orders and recommendations

## 2014-06-06 NOTE — Patient Instructions (Signed)
   Albuterol is a rescue inhaler which should be used as infrequently as possible; it should never be used more than 1-2 puffs every 4 hours. It may  be used 15-30 minutes before exercise if that is also  a trigger for your asthma.   If it is required more than 2-3 times per week; the asthma is not well controlled. Symbicort , Dulera , Advair or other maintenance agent should be considered to control smooth muscle spasm and airway inflammation  and to prevent adverse effects from excess albuterol use.Those adverse effects can include health or life threatening heart rhythm irregularities.  Plain Mucinex (NOT D) for thick secretions ;force NON dairy fluids .   Nasal cleansing in the shower as discussed with lather of mild shampoo.After 10 seconds wash off lather while  exhaling through nostrils. Make sure that all residual soap is removed to prevent irritation.  Flonase OR Nasacort AQ 1 spray in each nostril twice a day as needed. Use the "crossover" technique into opposite nostril spraying toward opposite ear @ 45 degree angle, not straight up into nostril.  Plain Allegra (NOT D )  160 daily , Loratidine 10 mg , OR Zyrtec 10 mg @ bedtime  as needed for itchy eyes & sneezing.

## 2014-07-03 ENCOUNTER — Encounter: Payer: Self-pay | Admitting: Women's Health

## 2014-07-06 ENCOUNTER — Encounter: Payer: Self-pay | Admitting: Women's Health

## 2014-07-11 ENCOUNTER — Encounter: Payer: Self-pay | Admitting: Women's Health

## 2014-07-17 ENCOUNTER — Encounter: Payer: Self-pay | Admitting: Internal Medicine

## 2014-07-24 ENCOUNTER — Encounter: Payer: Self-pay | Admitting: Internal Medicine

## 2014-07-25 ENCOUNTER — Other Ambulatory Visit: Payer: Self-pay | Admitting: Internal Medicine

## 2014-07-25 DIAGNOSIS — J329 Chronic sinusitis, unspecified: Secondary | ICD-10-CM

## 2014-07-25 DIAGNOSIS — J4521 Mild intermittent asthma with (acute) exacerbation: Secondary | ICD-10-CM

## 2014-07-25 MED ORDER — PREDNISONE 20 MG PO TABS: 20.0000 mg | ORAL_TABLET | Freq: Two times a day (BID) | ORAL | Status: DC

## 2014-08-07 ENCOUNTER — Ambulatory Visit (INDEPENDENT_AMBULATORY_CARE_PROVIDER_SITE_OTHER)
Admission: RE | Admit: 2014-08-07 | Discharge: 2014-08-07 | Disposition: A | Discharge status: Home / Self Care | Payer: BLUE CROSS/BLUE SHIELD | Source: Ambulatory Visit | Attending: Internal Medicine | Admitting: Internal Medicine

## 2014-08-07 ENCOUNTER — Encounter: Payer: Self-pay | Admitting: Internal Medicine

## 2014-08-07 ENCOUNTER — Ambulatory Visit (INDEPENDENT_AMBULATORY_CARE_PROVIDER_SITE_OTHER): Payer: BLUE CROSS/BLUE SHIELD | Admitting: Internal Medicine

## 2014-08-07 VITALS — BP 122/74 | HR 74 | Ht 62.0 in | Wt 139.0 lb

## 2014-08-07 DIAGNOSIS — R0600 Dyspnea, unspecified: Secondary | ICD-10-CM

## 2014-08-07 NOTE — Progress Notes (Addendum)
Subjective:     Patient ID: Tara Carpenter, female   DOB: 07/24/1960,    MRN: [id number]  HPI  37 yowf never smoker onset of asthma 2nd pregancy 1st trimester manifested by cough better with prednisone / inhalers allergy w/u by Donneta Romberg pos trees/grasses  And ? Better p  Shots not clear but able to stop all meds with no flare though  some sinus issues = nasal polyposis  >>3 surgeries redman/ lee/matthews last 2014  maintained on sporonox/singulair and rare need for albuterol but since the first jan 2016 not doing was well with cough/ sob so referred by Dr Linna Darner to pulmonary clinic 08/07/2014    08/07/2014 1st [facility] office visit/ Tara Carpenter   Chief Complaint  Patient presents with  . Pulmonary Consult    Referred by Dr. Unice Cobble. Pt states that she was dxed with asthma during her second pregnancy in 1995. She c/o wheezing and "hard to take in a deep breath" since Jan 2016.    in Dec 2015 started noticing need for albuterol several times per week helped a lot but symbicort did not work well  Then prednisone improved by 75% still not able to take full breath, occ cough  Dry day > noct mostly dry  Assoc with more sinus problems than usual   Back to exercising but tolerance not back to nl for her  Ok at hs   No obvious day to day or daytime variabilty or assoc   cp   or hb symptoms. No unusual exp hx or h/o childhood pna/ asthma or knowledge of premature birth.  Sleeping ok without nocturnal  or early am exacerbation  of respiratory  c/o's or need for noct saba. Also denies any obvious fluctuation of symptoms with weather or environmental changes or other aggravating or alleviating factors except as outlined above   Current Medications, Allergies, Complete Past Medical History, Past Surgical History, Family History, and Social History were reviewed in [employer] record.  ROS  The following are not active complaints unless bolded sore throat, dysphagia, dental  problems, itching, sneezing,  nasal congestion or excess/ purulent secretions, ear ache,   fever, chills, sweats, unintended wt loss, pleuritic or exertional cp, hemoptysis,  orthopnea pnd or leg swelling, presyncope, palpitations, heartburn, abdominal pain, anorexia, nausea, vomiting, diarrhea  or change in bowel or urinary habits, change in stools or urine, dysuria,hematuria,  rash, arthralgias, visual complaints, headache, numbness weakness or ataxia or problems with walking or coordination,  change in mood/affect or memory.        Review of Systems     Objective:   Physical Exam  amb wf nad  Wt Readings from Last 3 Encounters:  08/07/14 139 lb (63.05 kg)  06/06/14 142 lb (64.411 kg)  02/16/14 136 lb (61.689 kg)    Vital signs reviewed   HEENT: nl dentition, turbinates, and orophanx. Nl external ear canals without cough reflex   NECK :  without JVD/Nodes/TM/ nl carotid upstrokes bilaterally   LUNGS: no acc muscle use, clear to A and P bilaterally without cough on insp or exp maneuvers   CV:  RRR  no s3 or murmur or increase in P2, no edema   ABD:  soft and nontender with nl excursion in the supine position. No bruits or organomegaly, bowel sounds nl  MS:  warm without deformities, calf tenderness, cyanosis or clubbing  SKIN: warm and dry without lesions    NEURO:  alert, approp, no deficits  CXR PA and Lateral:   08/07/2014 :     I personally reviewed images and agree with radiology impression as follows:     No active cardiopulmonary disease.      Assessment:

## 2014-08-07 NOTE — Patient Instructions (Addendum)
GERD (REFLUX)  is an extremely common cause of respiratory symptoms just like yours , many times with no obvious heartburn at all.    It can be treated with medication, but also with lifestyle changes including avoidance of late meals, excessive alcohol, smoking cessation, and avoid fatty foods, chocolate, peppermint, colas, red wine, and acidic juices such as orange juice.  NO MINT OR MENTHOL PRODUCTS SO NO COUGH DROPS  USE SUGARLESS CANDY INSTEAD (Jolley ranchers or Stover's or Life Savers) or even ice chips will also do - the key is to swallow to prevent all throat clearing. NO OIL BASED VITAMINS - use powdered substitutes.    Work on Doctor, hospital technique:  relax and gently blow all the way out then take a nice smooth deep breath back in, triggering the inhaler at same time you start breathing in.  Hold for up to 5 seconds if you can.  Rinse and gargle with water when done  If getting worse > add Try prilosec 20mg   Take 30-60 min before first meal of the day and Pepcid 20 mg one bedtime      Please remember to go to the  x-ray department downstairs for your tests - we will call you with the results when they are available.  If not better see me  after a month, if 100% better ok to taper symbicort after a month

## 2014-08-08 ENCOUNTER — Encounter: Payer: Self-pay | Admitting: Internal Medicine

## 2014-08-08 NOTE — Progress Notes (Signed)
Quick Note:  Spoke with pt and notified of results per Dr. Wert. Pt verbalized understanding and denied any questions.  ______ 

## 2014-08-08 NOTE — Progress Notes (Signed)
Quick Note:  LMTCB ______ 

## 2014-08-12 ENCOUNTER — Encounter: Payer: Self-pay | Admitting: Internal Medicine

## 2014-08-12 NOTE — Assessment & Plan Note (Addendum)
-   spirometry 08/07/2014 min airflow obst  - 08/07/2014 p extensive coaching HFA effectiveness =    75% (triggers late) > continue symbicort 160 2bid   Reports albuterol works better than symbicort and symptoms are markedly disproportionate to objective findings and not clear this is a lung problem but pt does appear to have difficult airway management issues. DDX of  difficult airways management all start with A and  include Adherence, Ace Inhibitors, Acid Reflux, Active Sinus Disease, Alpha 1 Antitripsin deficiency, Anxiety masquerading as Airways dz,  ABPA,  allergy(esp in young), Aspiration (esp in elderly), Adverse effects of DPI,  Active smokers, plus two Bs  = Bronchiectasis and Beta blocker use..and one C= CHF   Adherence is always the initial "prime suspect" and is a multilayered concern that requires a "trust but verify" approach in every patient - starting with knowing how to use medications, especially inhalers, correctly, keeping up with refills and understanding the fundamental difference between maintenance and prns vs those medications only taken for a very short course and then stopped and not refilled. The proper method of use, as well as anticipated side effects, of a metered-dose inhaler are discussed and demonstrated to the patient. Improved effectiveness after extensive coaching during this visit to a level of approximately  75% from a baseline of < 25 % so needs to first optimize rx before deciding whether really needs it or not  ? Acid (or non-acid) GERD > always difficult to exclude as up to 75% of pts in some series report no assoc GI/ Heartburn symptoms> rec max (24h)  acid suppression and diet restrictions/ reviewed and instructions given in writing.  ? Active sinus dz > defer to ENT/ wfu   ? Allergy > better p prednisone suggests a component and if uses symbicort correctly and takes singulair should be adequate rx or consider sharma re -eval.  ? Anxiety >dx of exclusion     Each maintenance medication was reviewed in detail including most importantly the difference between maintenance and as needed and under what circumstances the prns are to be used.  Please see instructions for details which were reviewed in writing and the patient given a copy.

## 2014-10-03 ENCOUNTER — Other Ambulatory Visit: Payer: BLUE CROSS/BLUE SHIELD

## 2014-10-03 DIAGNOSIS — E7800 Pure hypercholesterolemia, unspecified: Secondary | ICD-10-CM

## 2014-10-03 LAB — LIPID PANEL
Cholesterol: 275 mg/dL — ABNORMAL HIGH (ref 0–200)
HDL: 72 mg/dL (ref >=46)
LDL Cholesterol: 173 mg/dL — ABNORMAL HIGH (ref 0–99)
Total CHOL/HDL Ratio: 3.8 Ratio
Triglycerides: 152 mg/dL — ABNORMAL HIGH (ref <150)
VLDL: 30 mg/dL (ref 0–40)

## 2014-10-10 ENCOUNTER — Other Ambulatory Visit (INDEPENDENT_AMBULATORY_CARE_PROVIDER_SITE_OTHER): Payer: BLUE CROSS/BLUE SHIELD

## 2014-10-10 ENCOUNTER — Ambulatory Visit (INDEPENDENT_AMBULATORY_CARE_PROVIDER_SITE_OTHER): Payer: BLUE CROSS/BLUE SHIELD | Admitting: Internal Medicine

## 2014-10-10 ENCOUNTER — Encounter: Payer: Self-pay | Admitting: Internal Medicine

## 2014-10-10 VITALS — BP 130/78 | HR 78 | Temp 97.9°F | Wt 142.0 lb

## 2014-10-10 DIAGNOSIS — E785 Hyperlipidemia, unspecified: Secondary | ICD-10-CM

## 2014-10-10 LAB — TSH: TSH: 2.04 u[IU]/mL (ref 0.35–4.50)

## 2014-10-10 LAB — T4, FREE: Free T4: 0.67 ng/dL (ref 0.60–1.60)

## 2014-10-10 NOTE — Progress Notes (Signed)
Pre visit review using our clinic review tool, if applicable. No additional management support is needed unless otherwise documented below in the visit note. 

## 2014-10-10 NOTE — Patient Instructions (Signed)
Please follow a Mediaterranean type diet  (many good cook books readily available) or review Dr Nunzio Cory book Eat, [place] for best  dietary cholesterol information & options.   Continue excellent cardiovascular exercise program 30-45 minutes 3-4 times per week.  An advanced cholesterol panel (NMR Lipoprofile Lipid Panel) after 3-4 months of nutritional & exercise changes is best way to optimally assess long LDL risk.

## 2014-10-10 NOTE — Assessment & Plan Note (Signed)
NMR Lipoprofile,TSH in 3-4 mos

## 2014-10-10 NOTE — Progress Notes (Signed)
   Subjective:    Patient ID: Tara Carpenter, female    DOB: [birth year], 54 y.o.   MRN: [id number]  HPI  She had fasting lipids and her gynecologist's office 10/03/14. Her LDL had risen from 135 up to 173. HDL was 72 & triglycerides 152. Previous advanced cholesterol testing had revealed an LDL of 121 with 1314 total particles and 98 small dense particles. At that time triglycerides were 195 and HDL 80.  There is premature heart attack in her father before the age of 11. Her mother had TIA at 58.  She is on a modified heart healthy diet with decreased fried foods and decreased red meat. She exercises 5 times a week as Pilates as well as a mixture of Pilates and yoga for 60 minutes without cardiopulmonary symptoms. Last TSH was 1.662 in 9/13  Review of Systems  Chest pain, palpitations, tachycardia, exertional dyspnea, paroxysmal nocturnal dyspnea, claudication or edema are absent.      Objective:   Physical Exam  Appears healthy and well-nourished & in no acute distress  No carotid bruits are present.No neck vein distention present at 10 - 15 degrees. Thyroid normal to palpation  Heart rhythm and rate are normal with no gallop or murmur  Chest is clear with no increased work of breathing  There is no evidence of aortic aneurysm or renal artery bruits  Abdomen soft with no organomegaly or masses. No HJR  No clubbing, cyanosis or edema present.  Pedal pulses are intact   No ischemic skin changes are present . Fingernails healthy   Alert and oriented. Strength, tone, DTRs reflexes normal        Assessment & Plan:  See Current Assessment & Plan in Problem List under specific Diagnosis

## 2015-01-04 ENCOUNTER — Ambulatory Visit (INDEPENDENT_AMBULATORY_CARE_PROVIDER_SITE_OTHER): Payer: BLUE CROSS/BLUE SHIELD | Admitting: Internal Medicine

## 2015-01-04 ENCOUNTER — Encounter: Payer: Self-pay | Admitting: Internal Medicine

## 2015-01-04 ENCOUNTER — Other Ambulatory Visit (INDEPENDENT_AMBULATORY_CARE_PROVIDER_SITE_OTHER): Payer: BLUE CROSS/BLUE SHIELD

## 2015-01-04 VITALS — BP 124/84 | HR 53 | Temp 97.9°F | Resp 16 | Wt 140.0 lb

## 2015-01-04 DIAGNOSIS — T148XXA Other injury of unspecified body region, initial encounter: Secondary | ICD-10-CM

## 2015-01-04 DIAGNOSIS — T148 Other injury of unspecified body region: Secondary | ICD-10-CM

## 2015-01-04 LAB — CBC WITH DIFFERENTIAL/PLATELET
BASOS ABS: 0 10*3/uL (ref 0.0–0.1)
Basophils Relative: 0.4 % (ref 0.0–3.0)
EOS ABS: 0.1 10*3/uL (ref 0.0–0.7)
Eosinophils Relative: 2 % (ref 0.0–5.0)
HCT: 41.2 % (ref 36.0–46.0)
HEMOGLOBIN: 14 g/dL (ref 12.0–15.0)
LYMPHS PCT: 17.2 % (ref 12.0–46.0)
Lymphs Abs: 1.2 10*3/uL (ref 0.7–4.0)
MCHC: 34.1 g/dL (ref 30.0–36.0)
MCV: 98.1 fl (ref 78.0–100.0)
Monocytes Absolute: 0.4 10*3/uL (ref 0.1–1.0)
Monocytes Relative: 5.2 % (ref 3.0–12.0)
NEUTROS ABS: 5.4 10*3/uL (ref 1.4–7.7)
Neutrophils Relative %: 75.2 % (ref 43.0–77.0)
PLATELETS: 266 10*3/uL (ref 150.0–400.0)
RBC: 4.2 Mil/uL (ref 3.87–5.11)
RDW: 12.6 % (ref 11.5–15.5)
WBC: 7.2 10*3/uL (ref 4.0–10.5)

## 2015-01-04 LAB — APTT: aPTT: 25.5 s (ref 23.4–32.7)

## 2015-01-04 LAB — PROTIME-INR
INR: 0.9 ratio (ref 0.8–1.0)
PROTHROMBIN TIME: 10 s (ref 9.6–13.1)

## 2015-01-04 NOTE — Progress Notes (Signed)
   Subjective:    Patient ID: Tara Carpenter, female    DOB: 06/24/1960, 54 y.o.   MRN: [id number]  HPI  She has had easy bruising for approximately 5 years. This seems to have been exacerbated this Summer. She does not take aspirin, NSAIDS, garlic supplements, or any other agents which might cause this except for an inhaled nasal steroid. She is no longer on oral prednisone.  She has no constitutional symptoms.  There is no personal or family history of coagulopathy or bleeding dyscrasias.   Review of Systems Epistaxis, hemoptysis, hematuria, melena, or rectal bleeding denied. No unexplained weight loss, significant dyspepsia,dysphagia, or abdominal pain.  There is no abnormal bleeding, or difficulty stopping bleeding with injury.     Objective:   Physical Exam Pertinent or positive findings include: She has scattered faint bruising; the most striking one is of the left inferior shin.  General appearance :adequately nourished; in no distress.  Eyes: No conjunctival inflammation or scleral icterus is present.  Oral exam:  Lips and gums are healthy appearing.There is no oropharyngeal erythema or exudate noted. Dental hygiene is good.  Heart:  Normal rate and regular rhythm. S1 and S2 normal without gallop, murmur, click, rub or other extra sounds    Lungs:Chest clear to auscultation; no wheezes, rhonchi,rales ,or rubs present.No increased work of breathing.   Abdomen: bowel sounds normal, soft and non-tender without masses, organomegaly or hernias noted.  No guarding or rebound.   Vascular : all pulses equal ; no bruits present.  Skin:Warm & dry.  Intact without suspicious lesions or rashes ; no tenting or jaundice   Lymphatic: No lymphadenopathy is noted about the head, neck, axilla  Neuro: Strength, tone & DTRs normal.        Assessment & Plan:  #1 Easy bruising, possibly related to excess inhaled steroid. She'll discuss this with her Pharmacist.  Plan: See orders and  recommendations

## 2015-01-04 NOTE — Patient Instructions (Signed)
  Your next office appointment will be determined based upon review of your pending labs . Those written interpretation of the lab results and instructions will be transmitted to you by My Chart  Critical results will be called.   Followup as needed for any active or acute issue. Please report any significant change in your symptoms.  Please discuss the possibility of the inhaled steroid contributing to the bruising with your Pharmacist

## 2015-01-04 NOTE — Progress Notes (Signed)
Pre visit review using our clinic review tool, if applicable. No additional management support is needed unless otherwise documented below in the visit note. 

## 2015-01-05 DIAGNOSIS — R238 Other skin changes: Secondary | ICD-10-CM | POA: Insufficient documentation

## 2015-01-05 DIAGNOSIS — R233 Spontaneous ecchymoses: Secondary | ICD-10-CM | POA: Insufficient documentation

## 2015-02-06 ENCOUNTER — Encounter: Payer: Self-pay | Admitting: Internal Medicine

## 2015-02-13 ENCOUNTER — Ambulatory Visit (INDEPENDENT_AMBULATORY_CARE_PROVIDER_SITE_OTHER): Payer: BLUE CROSS/BLUE SHIELD | Admitting: Internal Medicine

## 2015-02-13 ENCOUNTER — Other Ambulatory Visit: Payer: Self-pay | Admitting: Internal Medicine

## 2015-02-13 ENCOUNTER — Other Ambulatory Visit (INDEPENDENT_AMBULATORY_CARE_PROVIDER_SITE_OTHER): Payer: BLUE CROSS/BLUE SHIELD

## 2015-02-13 ENCOUNTER — Encounter: Payer: Self-pay | Admitting: Internal Medicine

## 2015-02-13 VITALS — BP 128/86 | HR 52 | Temp 98.2°F | Resp 16 | Wt 142.0 lb

## 2015-02-13 DIAGNOSIS — E079 Disorder of thyroid, unspecified: Secondary | ICD-10-CM

## 2015-02-13 DIAGNOSIS — R221 Localized swelling, mass and lump, neck: Secondary | ICD-10-CM

## 2015-02-13 DIAGNOSIS — R5383 Other fatigue: Secondary | ICD-10-CM | POA: Diagnosis not present

## 2015-02-13 DIAGNOSIS — E785 Hyperlipidemia, unspecified: Secondary | ICD-10-CM

## 2015-02-13 LAB — TSH: TSH: 1.65 u[IU]/mL (ref 0.35–4.50)

## 2015-02-13 NOTE — Progress Notes (Signed)
   Subjective:    Patient ID: Tara Carpenter, female    DOB: [birth year], 54 y.o.   MRN: [id number]  HPI   A friend noted swelling at the anterior base of the neck 2 weeks ago; this was asymptomatic.  She has had fatigue and has had a 4-5 pound weight gain the last 2 months.  She also has some dyspepsia which was more frequent this Summer.She took Zantac as needed.  She has 1 glass of wine nightly and 2 cups of coffee daily. She also will have a small serving of chocolate daily. She does not use not take NSAIDS, eat /chew peppermint, or drink tea or colas. She questions abnormal curve of the upper thoracic spine. Her Gynecologist does her bone density; she is overdue.  Review of Systems   No significant sleep disorder; change in appetite;weight change. No blurred, double ,loss of vision No palpitations; racing; irregularity No constipation; diarrhea;hoarseness. No change in nails,skin or skin No numbness or tingling; tremor No anxiety; depression; panic attacks No temperature intolerance to heat ,cold      Objective:   Physical Exam Pertinent or positive findings include: Lipomatous change is suggested over the suprasternal area as well as at the lateral base of the neck bilaterally. The right lobe of the thyroid is clinically small. There is accentuation of the upper thoracic curvature.  General appearance :adequately nourished; in no distress.  Eyes: No conjunctival inflammation or scleral icterus is present.  Oral exam:  Lips and gums are healthy appearing.There is no oropharyngeal erythema or exudate noted. Dental hygiene is good.  Heart:  Normal rate and regular rhythm. S1 and S2 normal without gallop, murmur, click, rub or other extra sounds    Lungs:Chest clear to auscultation; no wheezes, rhonchi,rales ,or rubs present.No increased work of breathing.   Abdomen: bowel sounds normal, soft and non-tender without masses, organomegaly or hernias noted.  No guarding or rebound.    Vascular : all pulses equal ; no bruits present.  Skin:Warm & dry.  Intact without suspicious lesions or rashes ; no tenting or jaundice   Lymphatic: No lymphadenopathy is noted about the head, neck, axilla.   Neuro: Strength, tone & DTRs normal.     Assessment & Plan:  #1 neck swelling; clinically lipomatous change suggested  #2 asymmetric thyroid  #3 fatigue  #4 accentuated thoracic curvature; rule out osteopenia/osteoporosis  See orders and recommendations

## 2015-02-13 NOTE — Progress Notes (Signed)
Pre visit review using our clinic review tool, if applicable. No additional management support is needed unless otherwise documented below in the visit note. 

## 2015-02-13 NOTE — Patient Instructions (Signed)
  Your next office appointment will be determined based upon review of your pending labs  and  [place]  Those written interpretation of the lab results and instructions will be transmitted to you by My Chart   Critical results will be called.   Followup as needed for any active or acute issue. Please report any significant change in your symptoms.

## 2015-02-15 LAB — NMR LIPOPROFILE WITH LIPIDS
Cholesterol, Total: 292 mg/dL — ABNORMAL HIGH (ref 100–199)
HDL PARTICLE NUMBER: 48.5 umol/L (ref >=30.5)
HDL SIZE: 9 nm — AB (ref >=9.2)
HDL-C: 75 mg/dL (ref >39)
LDL CALC: 166 mg/dL — AB (ref 0–99)
LDL Particle Number: 2338 nmol/L — ABNORMAL HIGH (ref <1000)
LDL SIZE: 21.2 nm (ref >=20.8)
LP-IR SCORE: 35 (ref <=45)
Large HDL-P: 8 umol/L (ref >=4.8)
Large VLDL-P: 1.4 nmol/L (ref <=2.7)
SMALL LDL PARTICLE NUMBER: 1004 nmol/L — AB (ref <=527)
Triglycerides: 253 mg/dL — ABNORMAL HIGH (ref 0–149)
VLDL SIZE: 44.8 nm (ref <=46.6)

## 2015-02-17 ENCOUNTER — Encounter: Payer: Self-pay | Admitting: Internal Medicine

## 2015-02-27 ENCOUNTER — Ambulatory Visit (INDEPENDENT_AMBULATORY_CARE_PROVIDER_SITE_OTHER): Payer: BLUE CROSS/BLUE SHIELD | Admitting: Women's Health

## 2015-02-27 ENCOUNTER — Encounter: Payer: Self-pay | Admitting: Women's Health

## 2015-02-27 VITALS — BP 116/70 | Ht 62.5 in | Wt 142.0 lb

## 2015-02-27 DIAGNOSIS — Z01419 Encounter for gynecological examination (general) (routine) without abnormal findings: Secondary | ICD-10-CM

## 2015-02-27 DIAGNOSIS — M8580 Other specified disorders of bone density and structure, unspecified site: Secondary | ICD-10-CM

## 2015-02-27 DIAGNOSIS — Z13820 Encounter for screening for osteoporosis: Secondary | ICD-10-CM | POA: Diagnosis not present

## 2015-02-27 DIAGNOSIS — Z79890 Hormone replacement therapy (postmenopausal): Secondary | ICD-10-CM | POA: Diagnosis not present

## 2015-02-27 LAB — LIPID PANEL
CHOL/HDL RATIO: 3.8 ratio (ref <=5.0)
Cholesterol: 276 mg/dL — ABNORMAL HIGH (ref 125–200)
HDL: 73 mg/dL (ref >=46)
LDL Cholesterol: 168 mg/dL — ABNORMAL HIGH (ref <130)
Triglycerides: 173 mg/dL — ABNORMAL HIGH (ref <150)
VLDL: 35 mg/dL — AB (ref <30)

## 2015-02-27 LAB — GLUCOSE, RANDOM: GLUCOSE: 82 mg/dL (ref 65–99)

## 2015-02-27 MED ORDER — ESTRADIOL-NORETHINDRONE ACET 0.5-0.1 MG PO TABS: 1.0000 | ORAL_TABLET | Freq: Every day | ORAL | Status: DC

## 2015-02-27 NOTE — Progress Notes (Signed)
Patient ID: TANISH SINKLER, female   DOB: [birth year], 54 y.o.   MRN: [id number] NAJEE COWENS December 06, 1960 [id number]    History:    Presents for annual exam.  Postmenopausal on HRTx 4 years, with light bleeding for 3 days every 4-5 months, would like to discontinue HRT. Negative endometrial biopsy 2013 after light bleeding. 2013 DEXA T score -1.5 at the right hip FRAX6.8%/.7%. 1990s LEEP with normal Paps after, 2015. Benign mammogram history, with most recent mammogram (2/16) followed by u/s showing benign cysts. Problems with asthma, hyperlipidemia, and chronic sinusitis primary care manages, is having an ultrasound tomorrow for likely lipomatous changes in neck. History of GDM.  Past medical history, past surgical history, family history and social history were all reviewed and documented in the EPIC chart. Works part-time in a Engineer, technical sales.  Exercises 5x/week, and eats a mostly healthy diet.  Mother died of colon cancer at 67, getting colonoscopies every 5 years.  Father had heart attack in his early 63's. Oldest son working in [place], second son at [employer] state, daughter is a Museum/gallery exhibitions officer at [employer] and is having a hard time adjusting, she wanted to go to [employer] state.  ROS:  A ROS was performed and pertinent positives and negatives are included.  Exam:  Filed Vitals:   02/27/15 0840  BP: 116/70    General appearance:  Normal Thyroid:  Symmetrical, normal in size, without palpable masses or nodularity. Respiratory  Auscultation:  Clear without wheezing or rhonchi Cardiovascular  Auscultation:  Regular rate, without rubs, murmurs or gallops  Edema/varicosities:  Not grossly evident Abdominal  Soft,nontender, without masses, guarding or rebound.  Liver/spleen:  No organomegaly noted  Hernia:  None appreciated  Skin  Inspection:  Grossly normal   Breasts: Examined lying and sitting.     Right: Without masses, retractions, discharge or axillary adenopathy.     Left: Without masses, retractions,  discharge or axillary adenopathy. Gentitourinary   Inguinal/mons:  Normal without inguinal adenopathy  External genitalia:  Normal  BUS/Urethra/Skene's glands:  Normal  Vagina:  Normal  Cervix:  Normal  Uterus:  anteverted, normal in size, shape and contour.  Midline and mobile  Adnexa/parametria:     Rt: Without masses or tenderness.   Lt: Without masses or tenderness.  Anus and perineum: Normal  Digital rectal exam: Normal sphincter tone without palpated masses or tenderness  Assessment/Plan:  54 y.o. MWF G3P3  for annual exam, would like to discontinue HRT    Postmenopausal on HRT with minimal bleeding Osteopenia without elevated FRAX LEEP in the 90s with normal Paps after Asthma/chronic sinusitis/hyperlipidemia-primary care manages  Plan: Cholesterol, glucose, and UA today.  Schedule DEXA, discussed importance of exercise and fall prevention.  SBE's, continue annual screening mammogram with 3-D tomography, history of dense breast tissue.  Continue colonoscopy every 5 years, Dr. Maurene Capes retired, requests a female, will schedule appointment with Dr. Collene Mares.  HRT reviewed, risks of blood clots, strokes, breast cancer, Activella 0.5/0.1 prescription given. Wean off HRT by taking every other day and then moving to half pills.  Continue current exercise plan and eat a healthy low carb diet. 2015 Pap normal with negative HR HPV, no Pap today.   Huel Cote [facility], 9:32 AM 02/27/2015

## 2015-02-27 NOTE — Patient Instructions (Signed)

## 2015-02-28 ENCOUNTER — Ambulatory Visit
Admission: RE | Admit: 2015-02-28 | Discharge: 2015-02-28 | Disposition: A | Discharge status: Home / Self Care | Payer: BLUE CROSS/BLUE SHIELD | Source: Ambulatory Visit | Attending: Internal Medicine | Admitting: Internal Medicine

## 2015-02-28 DIAGNOSIS — R221 Localized swelling, mass and lump, neck: Secondary | ICD-10-CM

## 2015-02-28 DIAGNOSIS — E079 Disorder of thyroid, unspecified: Secondary | ICD-10-CM

## 2015-02-28 LAB — URINALYSIS W MICROSCOPIC + REFLEX CULTURE
Bacteria, UA: NONE SEEN [HPF]
Bilirubin Urine: NEGATIVE
CASTS: NONE SEEN [LPF]
Crystals: NONE SEEN [HPF]
GLUCOSE, UA: NEGATIVE
HGB URINE DIPSTICK: NEGATIVE
KETONES UR: NEGATIVE
NITRITE: NEGATIVE
PH: 5.5 (ref 5.0–8.0)
Protein, ur: NEGATIVE
RBC / HPF: NONE SEEN RBC/HPF (ref <=2)
Specific Gravity, Urine: 1.017 (ref 1.001–1.035)
Yeast: NONE SEEN [HPF]

## 2015-03-01 LAB — URINE CULTURE
COLONY COUNT: NO GROWTH
Organism ID, Bacteria: NO GROWTH

## 2015-03-03 ENCOUNTER — Other Ambulatory Visit: Payer: Self-pay | Admitting: Women's Health

## 2015-03-13 ENCOUNTER — Encounter: Payer: Self-pay | Admitting: Gastroenterology

## 2015-03-29 ENCOUNTER — Encounter: Payer: Self-pay | Admitting: Internal Medicine

## 2015-06-03 ENCOUNTER — Ambulatory Visit (INDEPENDENT_AMBULATORY_CARE_PROVIDER_SITE_OTHER): Payer: BLUE CROSS/BLUE SHIELD

## 2015-06-03 DIAGNOSIS — M8589 Other specified disorders of bone density and structure, multiple sites: Secondary | ICD-10-CM

## 2015-06-03 DIAGNOSIS — Z13820 Encounter for screening for osteoporosis: Secondary | ICD-10-CM

## 2015-06-03 DIAGNOSIS — M8580 Other specified disorders of bone density and structure, unspecified site: Secondary | ICD-10-CM

## 2015-07-11 ENCOUNTER — Encounter: Payer: Self-pay | Admitting: Women's Health

## 2016-03-11 ENCOUNTER — Encounter: Payer: Self-pay | Admitting: Women's Health

## 2016-03-11 ENCOUNTER — Ambulatory Visit (INDEPENDENT_AMBULATORY_CARE_PROVIDER_SITE_OTHER): Payer: 59 | Admitting: Women's Health

## 2016-03-11 ENCOUNTER — Other Ambulatory Visit: Payer: Self-pay | Admitting: Women's Health

## 2016-03-11 VITALS — BP 122/80 | Ht 62.0 in | Wt 138.0 lb

## 2016-03-11 DIAGNOSIS — Z1329 Encounter for screening for other suspected endocrine disorder: Secondary | ICD-10-CM

## 2016-03-11 DIAGNOSIS — Z13220 Encounter for screening for lipoid disorders: Secondary | ICD-10-CM | POA: Diagnosis not present

## 2016-03-11 DIAGNOSIS — Z01419 Encounter for gynecological examination (general) (routine) without abnormal findings: Secondary | ICD-10-CM

## 2016-03-11 LAB — CBC WITH DIFFERENTIAL/PLATELET
BASOS PCT: 1 %
Basophils Absolute: 66 cells/uL (ref 0–200)
EOS PCT: 0 %
Eosinophils Absolute: 0 cells/uL — ABNORMAL LOW (ref 15–500)
HCT: 42.3 % (ref 35.0–45.0)
Hemoglobin: 13.9 g/dL (ref 11.7–15.5)
Lymphocytes Relative: 23 %
Lymphs Abs: 1518 cells/uL (ref 850–3900)
MCH: 31.8 pg (ref 27.0–33.0)
MCHC: 32.9 g/dL (ref 32.0–36.0)
MCV: 96.8 fL (ref 80.0–100.0)
MONOS PCT: 8 %
MPV: 9.4 fL (ref 7.5–12.5)
Monocytes Absolute: 528 cells/uL (ref 200–950)
NEUTROS ABS: 4488 {cells}/uL (ref 1500–7800)
Neutrophils Relative %: 68 %
PLATELETS: 340 10*3/uL (ref 140–400)
RBC: 4.37 MIL/uL (ref 3.80–5.10)
RDW: 13 % (ref 11.0–15.0)
WBC: 6.6 10*3/uL (ref 3.8–10.8)

## 2016-03-11 LAB — LIPID PANEL
CHOLESTEROL: 364 mg/dL — AB (ref 125–200)
HDL: 72 mg/dL (ref >=46)
LDL Cholesterol: 231 mg/dL — ABNORMAL HIGH (ref <130)
Total CHOL/HDL Ratio: 5.1 Ratio — ABNORMAL HIGH (ref <=5.0)
Triglycerides: 306 mg/dL — ABNORMAL HIGH (ref <150)
VLDL: 61 mg/dL — AB (ref <30)

## 2016-03-11 LAB — URINALYSIS W MICROSCOPIC + REFLEX CULTURE
BACTERIA UA: NONE SEEN [HPF]
Bilirubin Urine: NEGATIVE
CASTS: NONE SEEN [LPF]
Crystals: NONE SEEN [HPF]
Glucose, UA: NEGATIVE
HGB URINE DIPSTICK: NEGATIVE
Ketones, ur: NEGATIVE
Nitrite: NEGATIVE
PROTEIN: NEGATIVE
RBC / HPF: NONE SEEN RBC/HPF (ref <=2)
Specific Gravity, Urine: 1.005 (ref 1.001–1.035)
WBC, UA: NONE SEEN WBC/HPF (ref <=5)
YEAST: NONE SEEN [HPF]
pH: 7 (ref 5.0–8.0)

## 2016-03-11 LAB — COMPREHENSIVE METABOLIC PANEL
ALT: 10 U/L (ref 6–29)
AST: 14 U/L (ref 10–35)
Albumin: 4.3 g/dL (ref 3.6–5.1)
Alkaline Phosphatase: 67 U/L (ref 33–130)
BUN: 14 mg/dL (ref 7–25)
CHLORIDE: 104 mmol/L (ref 98–110)
CO2: 26 mmol/L (ref 20–31)
CREATININE: 0.75 mg/dL (ref 0.50–1.05)
Calcium: 9.5 mg/dL (ref 8.6–10.4)
Glucose, Bld: 104 mg/dL — ABNORMAL HIGH (ref 65–99)
Potassium: 4.5 mmol/L (ref 3.5–5.3)
SODIUM: 140 mmol/L (ref 135–146)
TOTAL PROTEIN: 6.8 g/dL (ref 6.1–8.1)
Total Bilirubin: 0.6 mg/dL (ref 0.2–1.2)

## 2016-03-11 LAB — TSH: TSH: 0.94 m[IU]/L

## 2016-03-11 NOTE — Progress Notes (Signed)
Tara Carpenter [birth year] [id number]    History:    Presents for annual exam.  Postmenopausal on no HRT with no bleeding. Had been on HRT for 5 years hot flushes are much better. 2017 T score -0.5 FRAX 12%/2.1% history of steroid use. 1990s had a LEEP with normal Paps after. Normal mammogram history. 2011 benign colon polyp follow-up negative. Mother died of colon cancer. Continues to live a healthy lifestyle with regular exercise and diet. In use with problems with sinusitis. History of GDM with one pregnancy.  Past medical history, past surgical history, family history and social history were all reviewed and documented in the EPIC chart. 3 children all doing well, daughter at [employer] [facility] state, sons both graduated from [place] state when living in [place]. Works part-time at [employer].  ROS:  A ROS was performed and pertinent positives and negatives are included.  Exam:  Vitals:   03/11/16 0905  BP: 122/80  Weight: 138 lb (62.6 kg)  Height: 5\' 2"  (1.575 m)   Body mass index is 25.24 kg/m.   General appearance:  Normal Thyroid:  Symmetrical, normal in size, without palpable masses or nodularity. Respiratory  Auscultation:  Clear without wheezing or rhonchi Cardiovascular  Auscultation:  Regular rate, without rubs, murmurs or gallops  Edema/varicosities:  Not grossly evident Abdominal  Soft,nontender, without masses, guarding or rebound.  Liver/spleen:  No organomegaly noted  Hernia:  None appreciated  Skin  Inspection:  Grossly normal   Breasts: Examined lying and sitting.     Right: Without masses, retractions, discharge or axillary adenopathy.     Left: Without masses, retractions, discharge or axillary adenopathy. Gentitourinary   Inguinal/mons:  Normal without inguinal adenopathy  External genitalia:  Normal  BUS/Urethra/Skene's glands:  Normal  Vagina:  Normal  Cervix:  Normal  Uterus:  normal in size, shape and contour.  Midline and mobile  Adnexa/parametria:      Rt: Without masses or tenderness.   Lt: Without masses or tenderness.  Anus and perineum: Normal  Digital rectal exam: Normal sphincter tone without palpated masses or tenderness  Assessment/Plan:  55 y.o. M WF G4 P3 for annual exam no complaints.  Postmenopausal/no HRT/no bleeding Chronic sinusitis-allergist manages 1990s LEEP with normal Paps after History of osteopenia without elevated FRAX  Plan: Continue regular exercise, reviewed DEXA in FRAX, reviewed importance of regular weightbearing exercise, fall prevention discussed. SBE's, continue annual 3-D screening mammogram history of dense breasts. CBC, CMP, lipid panel, TSH, vitamin D, UA, Pap normal with negative HR HPV 2015, will repeat next year.    Huel Cote [facility], 9:58 AM 03/11/2016

## 2016-03-11 NOTE — Patient Instructions (Signed)

## 2016-03-12 LAB — URINE CULTURE

## 2016-03-12 LAB — VITAMIN D 25 HYDROXY (VIT D DEFICIENCY, FRACTURES): Vit D, 25-Hydroxy: 29 ng/mL — ABNORMAL LOW (ref 30–100)

## 2016-03-13 LAB — HEMOGLOBIN A1C
Hgb A1c MFr Bld: 5.7 % — ABNORMAL HIGH (ref <5.7)
MEAN PLASMA GLUCOSE: 117 mg/dL

## 2016-03-17 ENCOUNTER — Ambulatory Visit: Payer: BLUE CROSS/BLUE SHIELD | Admitting: Nurse Practitioner

## 2016-03-20 ENCOUNTER — Other Ambulatory Visit: Payer: Self-pay | Admitting: *Deleted

## 2016-03-20 ENCOUNTER — Encounter: Payer: Self-pay | Admitting: Nurse Practitioner

## 2016-03-20 ENCOUNTER — Ambulatory Visit (INDEPENDENT_AMBULATORY_CARE_PROVIDER_SITE_OTHER): Payer: 59 | Admitting: Nurse Practitioner

## 2016-03-20 VITALS — BP 122/86 | Temp 97.6°F | Ht 63.0 in | Wt 141.0 lb

## 2016-03-20 DIAGNOSIS — Z8249 Family history of ischemic heart disease and other diseases of the circulatory system: Secondary | ICD-10-CM

## 2016-03-20 DIAGNOSIS — E782 Mixed hyperlipidemia: Secondary | ICD-10-CM | POA: Diagnosis not present

## 2016-03-20 MED ORDER — OMEGA 3 1000 MG PO CAPS: 1.0000 | ORAL_CAPSULE | Freq: Two times a day (BID) | ORAL | 0 refills | Status: DC

## 2016-03-20 MED ORDER — ROSUVASTATIN CALCIUM 40 MG PO TABS: 40.0000 mg | ORAL_TABLET | Freq: Every day | ORAL | 3 refills | Status: DC

## 2016-03-20 NOTE — Progress Notes (Signed)
Subjective:  Patient ID: Tara Carpenter, female    DOB: 1960/07/01  Age: 55 y.o. MRN: [id number]  CC: Hyperlipidemia (Pt stated have high cholesterol and needed treatment.)   Hyperlipidemia  This is a chronic problem. The problem is uncontrolled. Recent lipid tests were reviewed and are high. She has no history of chronic renal disease, diabetes, hypothyroidism, liver disease or obesity. There are no known factors aggravating her hyperlipidemia. Pertinent negatives include no chest pain, focal sensory loss, focal weakness, leg pain, myalgias or shortness of breath. Current antihyperlipidemic treatment includes diet change and exercise. The current treatment provides no improvement of lipids. There are no compliance problems.  Risk factors for coronary artery disease include post-menopausal and family history.    Outpatient Medications Prior to Visit  Medication Sig Dispense Refill  . azithromycin (ZITHROMAX) 250 MG tablet TK 2 TS PO FOR 1 DAY THEN TK 1 T PO FOR 4 DAYS  0  . budesonide (PULMICORT) 0.25 MG/2ML nebulizer solution Take 0.25 mg by nebulization 2 (two) times daily.    . Calcium Carbonate-Vitamin D (CALCIUM + D PO) Take 1 tablet by mouth daily.     . Cetirizine HCl (ZYRTEC ALLERGY PO) Take 1 tablet by mouth daily.     . montelukast (SINGULAIR) 10 MG tablet TK 1 T PO HS  11  . Montelukast Sodium (SINGULAIR PO) Take 1 tablet by mouth daily.     . Multiple Vitamin (MULTIVITAMIN) capsule Take 1 capsule by mouth daily.      . predniSONE (DELTASONE) 20 MG tablet Take 2 pills (40 mg) daily starting 7 days before surgery, then continue 1 pill (20mg ) daily after surgery until the return appointment after surgery    . itraconazole (SPORANOX) 100 MG capsule TAKE 1 CAPSULE PO DAILY AND WEAN TO EVERY OTHER DAY  3  . budesonide-formoterol (SYMBICORT) 160-4.5 MCG/ACT inhaler Inhale 2 puffs into the lungs 2 (two) times daily. (Patient not taking: Reported on 03/20/2016) 1 Inhaler 11  . budesonide  (PULMICORT) 1 MG/2ML nebulizer solution PLACE 1 ML AS DROP INTO EACH NOSTRIL EVERY OTHER DAY.  5  . Itraconazole (SPORANOX PO) Take 200 mg by mouth daily.     Marland Kitchen sulfamethoxazole-trimethoprim (BACTRIM DS,SEPTRA DS) 800-160 MG tablet TK 1 T PO BID FOR 14 DAYS  0   No facility-administered medications prior to visit.     ROS See HPI  Objective:  BP 122/86 (BP Location: Left Arm, Patient Position: Sitting, Cuff Size: Normal)   Temp 97.6 F (36.4 C)   Ht 5\' 3"  (1.6 m)   Wt 141 lb (64 kg)   LMP 07/15/2011   SpO2 98%   BMI 24.98 kg/m   BP Readings from Last 3 Encounters:  03/20/16 122/86  03/11/16 122/80  02/27/15 116/70    Wt Readings from Last 3 Encounters:  03/20/16 141 lb (64 kg)  03/11/16 138 lb (62.6 kg)  02/27/15 142 lb (64.4 kg)    Physical Exam  Constitutional: She is oriented to person, place, and time. No distress.  HENT:  Mouth/Throat: No oropharyngeal exudate.  Neck: Normal range of motion. Neck supple. No JVD present. No thyromegaly present.  Cardiovascular: Normal rate, regular rhythm and normal heart sounds.   Pulmonary/Chest: Effort normal and breath sounds normal.  Musculoskeletal: Normal range of motion. She exhibits no edema.  Lymphadenopathy:    She has no cervical adenopathy.  Neurological: She is alert and oriented to person, place, and time.  Skin: Skin is warm and dry.  Psychiatric: She has a normal mood and affect. Her behavior is normal.  Vitals reviewed.   Lab Results  Component Value Date   WBC 6.6 03/11/2016   HGB 13.9 03/11/2016   HCT 42.3 03/11/2016   PLT 340 03/11/2016   GLUCOSE 104 (H) 03/11/2016   CHOL 364 (H) 03/11/2016   TRIG 306 (H) 03/11/2016   HDL 72 03/11/2016   LDLCALC 231 (H) 03/11/2016   ALT 10 03/11/2016   AST 14 03/11/2016   NA 140 03/11/2016   K 4.5 03/11/2016   CL 104 03/11/2016   CREATININE 0.75 03/11/2016   BUN 14 03/11/2016   CO2 26 03/11/2016   TSH 0.94 03/11/2016   INR 0.9 01/04/2015   HGBA1C 5.7 (H)  03/11/2016    US Soft Tissue Head/neck  Result Date: 02/28/2015 CLINICAL DATA:  Anterior neck swelling EXAM: THYROID ULTRASOUND TECHNIQUE: Ultrasound examination of the thyroid gland and adjacent soft tissues was performed. COMPARISON:  None. FINDINGS: Right thyroid lobe Measurements: 45 x 12 x 18 mm. Somewhat heterogeneous background echotexture. Several small complex cysts, all less than 3 mm. Left thyroid lobe Measurements: 48 x 13 x 13 mm. 7 x 4 x 6 mm hypoechoic nodule, inferior pole. Mild hyperemia. Isthmus Thickness: 2 mm.  No nodules visualized. Lymphadenopathy None visualized. IMPRESSION: 1. Normal-sized thyroid with a small left nodule. Findings do not meet current consensus criteria for biopsy. Follow-up by clinical exam is recommended. If patient has known risk factors for thyroid carcinoma, consider follow-up ultrasound in 12 months. If patient is clinically hyperthyroid, consider nuclear medicine thyroid uptake and scan. This recommendation follows the consensus statement: Management of Thyroid Nodules Detected as [place]: Society of Radiologists in [place]. Radiology 2005; [id number]. Electronically Signed   By: Lucrezia Carpenter M.D.   On: 02/28/2015 17:10    Assessment & Plan:   Tara Carpenter was seen today for hyperlipidemia.  Diagnoses and all orders for this visit:  Mixed hyperlipidemia -     rosuvastatin (CRESTOR) 40 MG tablet; Take 1 tablet (40 mg total) by mouth daily. -     Omega 3 1000 MG CAPS; Take 1 capsule (1,000 mg total) by mouth 2 (two) times daily after a meal. -     Lipid panel; Future -     CK total and CKMB (cardiac)not at [facility]; Future -     Hepatic function panel; Future  Family history of ischemic heart disease -     rosuvastatin (CRESTOR) 40 MG tablet; Take 1 tablet (40 mg total) by mouth daily. -     Omega 3 1000 MG CAPS; Take 1 capsule (1,000 mg total) by mouth 2 (two) times daily after a meal. -     Lipid panel; Future -     CK total  and CKMB (cardiac)not at [facility]; Future -     Hepatic function panel; Future   I have discontinued Tara Carpenter's Itraconazole (SPORANOX PO), sulfamethoxazole-trimethoprim, and itraconazole. I am also having her start on rosuvastatin and Omega 3. Additionally, I am having her maintain her multivitamin, Calcium Carbonate-Vitamin D (CALCIUM + D PO), Montelukast Sodium (SINGULAIR PO), Cetirizine HCl (ZYRTEC ALLERGY PO), budesonide-formoterol, budesonide, azithromycin, montelukast, and predniSONE.  Meds ordered this encounter  Medications  . rosuvastatin (CRESTOR) 40 MG tablet    Sig: Take 1 tablet (40 mg total) by mouth daily.    Dispense:  30 tablet    Refill:  3    Order Specific Question:   Supervising Provider    Answer:  PLOTNIKOV, ALEKSEI V [[id number]]  . Omega 3 1000 MG CAPS    Sig: Take 1 capsule (1,000 mg total) by mouth 2 (two) times daily after a meal.    Dispense:  60 each    Refill:  0    Order Specific Question:   Supervising Provider    Answer:   Cassandria Anger [[id number]]    Follow-up: Return in about 3 months (around 06/20/2016), or hyperlipidemia.  Wilfred Lacy, NP

## 2016-03-20 NOTE — Progress Notes (Signed)
Pre visit review using our clinic review tool, if applicable. No additional management support is needed unless otherwise documented below in the visit note. 

## 2016-03-20 NOTE — Patient Instructions (Signed)

## 2016-05-04 ENCOUNTER — Encounter: Payer: Self-pay | Admitting: Nurse Practitioner

## 2016-05-13 ENCOUNTER — Ambulatory Visit (INDEPENDENT_AMBULATORY_CARE_PROVIDER_SITE_OTHER): Payer: 59 | Admitting: Nurse Practitioner

## 2016-05-13 ENCOUNTER — Other Ambulatory Visit (INDEPENDENT_AMBULATORY_CARE_PROVIDER_SITE_OTHER): Payer: 59

## 2016-05-13 ENCOUNTER — Encounter: Payer: Self-pay | Admitting: Nurse Practitioner

## 2016-05-13 VITALS — BP 132/88 | HR 78 | Temp 98.2°F | Ht 62.5 in | Wt 142.0 lb

## 2016-05-13 DIAGNOSIS — M791 Myalgia, unspecified site: Secondary | ICD-10-CM

## 2016-05-13 DIAGNOSIS — E782 Mixed hyperlipidemia: Secondary | ICD-10-CM | POA: Diagnosis not present

## 2016-05-13 DIAGNOSIS — Z8249 Family history of ischemic heart disease and other diseases of the circulatory system: Secondary | ICD-10-CM

## 2016-05-13 DIAGNOSIS — M2550 Pain in unspecified joint: Secondary | ICD-10-CM | POA: Diagnosis not present

## 2016-05-13 LAB — SEDIMENTATION RATE: Sed Rate: 11 mm/hr (ref 0–30)

## 2016-05-13 NOTE — Progress Notes (Signed)
Subjective:  Patient ID: Tara Carpenter, female    DOB: [birth year]  Age: 55 y.o. MRN: [id number]  CC: Pain (joints pain3 wks. took tylanol)  Muscle Pain  This is a new problem. The current episode started 1 to 4 weeks ago. The problem occurs constantly. The problem is unchanged. Associated with: onset after use of crestor. Pain location: diffuse. The symptoms are aggravated by any movement. Associated symptoms include fatigue. Pertinent negatives include no abdominal pain, chest pain, fever, headaches, rash, sensory change, urinary symptoms, visual change, weakness or wheezing. Past treatments include acetaminophen. The treatment provided no relief. There is no swelling present. She has been behaving normally. There is no history of chronic back pain, rheumatic disease or sickle cell disease.    Outpatient Medications Prior to Visit  Medication Sig Dispense Refill  . budesonide (PULMICORT) 0.25 MG/2ML nebulizer solution Take 0.25 mg by nebulization 2 (two) times daily.    . budesonide-formoterol (SYMBICORT) 160-4.5 MCG/ACT inhaler Inhale 2 puffs into the lungs 2 (two) times daily. 1 Inhaler 11  . Calcium Carbonate-Vitamin D (CALCIUM + D PO) Take 1 tablet by mouth daily.     . Cetirizine HCl (ZYRTEC ALLERGY PO) Take 1 tablet by mouth daily.     . montelukast (SINGULAIR) 10 MG tablet TK 1 T PO HS  11  . Montelukast Sodium (SINGULAIR PO) Take 1 tablet by mouth daily.     . Multiple Vitamin (MULTIVITAMIN) capsule Take 1 capsule by mouth daily.      . Omega 3 1000 MG CAPS Take 1 capsule (1,000 mg total) by mouth 2 (two) times daily after a meal. 60 each 0  . predniSONE (DELTASONE) 20 MG tablet Take 2 pills (40 mg) daily starting 7 days before surgery, then continue 1 pill (79m) daily after surgery until the return appointment after surgery    . rosuvastatin (CRESTOR) 40 MG tablet Take 1 tablet (40 mg total) by mouth daily. 30 tablet 3  . azithromycin (ZITHROMAX) 250 MG tablet TK 2 TS PO FOR 1 DAY  THEN TK 1 T PO FOR 4 DAYS  0   No facility-administered medications prior to visit.     ROS See HPI  Objective:  BP 132/88   Pulse 78   Temp 98.2 F (36.8 C)   Ht 5' 2.5" (1.588 m)   Wt 142 lb (64.4 kg)   LMP 07/15/2011   SpO2 96%   BMI 25.56 kg/m   BP Readings from Last 3 Encounters:  05/13/16 132/88  03/20/16 122/86  03/11/16 122/80    Wt Readings from Last 3 Encounters:  05/13/16 142 lb (64.4 kg)  03/20/16 141 lb (64 kg)  03/11/16 138 lb (62.6 kg)    Physical Exam  Constitutional: She is oriented to person, place, and time. No distress.  HENT:  Right Ear: External ear normal.  Left Ear: External ear normal.  Nose: Nose normal.  Mouth/Throat: No oropharyngeal exudate.  Eyes: No scleral icterus.  Neck: Normal range of motion. Neck supple.  Cardiovascular: Normal rate, regular rhythm and normal heart sounds.   Pulmonary/Chest: Effort normal and breath sounds normal. No respiratory distress.  Abdominal: Soft. She exhibits no distension.  Musculoskeletal: Normal range of motion. She exhibits no edema or tenderness.  Lymphadenopathy:    She has no cervical adenopathy.  Neurological: She is alert and oriented to person, place, and time. She has normal reflexes. No cranial nerve deficit.  Skin: Skin is warm and dry.  Psychiatric: She has a  normal mood and affect. Her behavior is normal.    Lab Results  Component Value Date   WBC 6.6 03/11/2016   HGB 13.9 03/11/2016   HCT 42.3 03/11/2016   PLT 340 03/11/2016   GLUCOSE 104 (H) 03/11/2016   CHOL 364 (H) 03/11/2016   TRIG 306 (H) 03/11/2016   HDL 72 03/11/2016   LDLCALC 231 (H) 03/11/2016   ALT 10 03/11/2016   AST 14 03/11/2016   NA 140 03/11/2016   K 4.5 03/11/2016   CL 104 03/11/2016   CREATININE 0.75 03/11/2016   BUN 14 03/11/2016   CO2 26 03/11/2016   TSH 0.94 03/11/2016   INR 0.9 01/04/2015   HGBA1C 5.7 (H) 03/11/2016    US Soft Tissue Head/neck  Result Date: 02/28/2015 CLINICAL DATA:   Anterior neck swelling EXAM: THYROID ULTRASOUND TECHNIQUE: Ultrasound examination of the thyroid gland and adjacent soft tissues was performed. COMPARISON:  None. FINDINGS: Right thyroid lobe Measurements: 45 x 12 x 18 mm. Somewhat heterogeneous background echotexture. Several small complex cysts, all less than 3 mm. Left thyroid lobe Measurements: 48 x 13 x 13 mm. 7 x 4 x 6 mm hypoechoic nodule, inferior pole. Mild hyperemia. Isthmus Thickness: 2 mm.  No nodules visualized. Lymphadenopathy None visualized. IMPRESSION: 1. Normal-sized thyroid with a small left nodule. Findings do not meet current consensus criteria for biopsy. Follow-up by clinical exam is recommended. If patient has known risk factors for thyroid carcinoma, consider follow-up ultrasound in 12 months. If patient is clinically hyperthyroid, consider nuclear medicine thyroid uptake and scan. This recommendation follows the consensus statement: Management of Thyroid Nodules Detected as [place]: Society of Radiologists in [place]. Radiology 2005; [id number]. Electronically Signed   By: Lucrezia Europe M.D.   On: 02/28/2015 17:10    Assessment & Plan:   Tara Carpenter was seen today for pain.  Diagnoses and all orders for this visit:  Myalgia -     Sed Rate (ESR); Future  Arthralgia, unspecified joint  Mixed hyperlipidemia   I have discontinued Tara Carpenter's rosuvastatin. I am also having her maintain her multivitamin, Calcium Carbonate-Vitamin D (CALCIUM + D PO), Montelukast Sodium (SINGULAIR PO), Cetirizine HCl (ZYRTEC ALLERGY PO), budesonide-formoterol, budesonide, and montelukast.  No orders of the defined types were placed in this encounter.   Follow-up: Return in about 3 months (around 08/11/2016) for hyperlipidemia.  Wilfred Lacy, NP

## 2016-05-13 NOTE — Progress Notes (Signed)
Pre visit review using our clinic review tool, if applicable. No additional management support is needed unless otherwise documented below in the visit note. 

## 2016-05-13 NOTE — Patient Instructions (Addendum)
Started on fenofibrate and lovaza.  Use of meloxicam for pain. Ref to lipid clinic if no improvement in 61months.

## 2016-05-14 ENCOUNTER — Other Ambulatory Visit: Payer: Self-pay | Admitting: Nurse Practitioner

## 2016-05-14 ENCOUNTER — Encounter: Payer: Self-pay | Admitting: Nurse Practitioner

## 2016-05-14 DIAGNOSIS — E782 Mixed hyperlipidemia: Secondary | ICD-10-CM

## 2016-05-14 DIAGNOSIS — M791 Myalgia, unspecified site: Secondary | ICD-10-CM

## 2016-05-14 DIAGNOSIS — M2550 Pain in unspecified joint: Secondary | ICD-10-CM

## 2016-05-14 LAB — CK TOTAL AND CKMB (NOT AT ARMC)
CK, MB: 1.3 ng/mL (ref 0.0–5.0)
RELATIVE INDEX: 1.4 (ref 0.0–4.0)
Total CK: 93 U/L (ref 7–177)

## 2016-05-14 MED ORDER — FENOFIBRATE 145 MG PO TABS: 145.0000 mg | ORAL_TABLET | Freq: Every day | ORAL | 3 refills | Status: DC

## 2016-05-14 MED ORDER — MELOXICAM 7.5 MG PO TABS: 7.5000 mg | ORAL_TABLET | Freq: Every day | ORAL | 0 refills | Status: DC | PRN

## 2016-05-14 MED ORDER — OMEGA-3-ACID ETHYL ESTERS 1 G PO CAPS: 2.0000 g | ORAL_CAPSULE | Freq: Two times a day (BID) | ORAL | 3 refills | Status: DC

## 2016-05-14 NOTE — Progress Notes (Signed)
Crestor discontinued due to muscle and joint aches after starting medication. Normal CK total and sed rate. Started on fenofibrate and lovaza.

## 2016-05-14 NOTE — Assessment & Plan Note (Addendum)
Unable to tolerate crestor (muscle and joint pain) Normal CK total and sed rate. Started on fenofibrate and lovaza. Consider referral to lipid panel if no improvement in 80months.  Lipid Panel     Component Value Date/Time   CHOL 364 (H) 03/11/2016 0954   CHOL 292 (H) 02/13/2015 1621   TRIG 306 (H) 03/11/2016 0954   TRIG 253 (H) 02/13/2015 1621   HDL 72 03/11/2016 0954   HDL 75 02/13/2015 1621   CHOLHDL 5.1 (H) 03/11/2016 0954   VLDL 61 (H) 03/11/2016 0954   LDLCALC 231 (H) 03/11/2016 0954   LDLCALC 166 (H) 02/13/2015 1621

## 2016-05-14 NOTE — Progress Notes (Signed)
Normal results,

## 2016-05-25 ENCOUNTER — Encounter: Payer: Self-pay | Admitting: Nurse Practitioner

## 2016-05-26 ENCOUNTER — Other Ambulatory Visit: Payer: 59

## 2016-05-26 ENCOUNTER — Other Ambulatory Visit: Payer: Self-pay | Admitting: Nurse Practitioner

## 2016-05-26 DIAGNOSIS — M791 Myalgia, unspecified site: Secondary | ICD-10-CM

## 2016-05-26 DIAGNOSIS — M2550 Pain in unspecified joint: Secondary | ICD-10-CM

## 2016-05-26 MED ORDER — MELOXICAM 15 MG PO TABS: 15.0000 mg | ORAL_TABLET | Freq: Every day | ORAL | 0 refills | Status: DC | PRN

## 2016-05-27 ENCOUNTER — Encounter: Payer: Self-pay | Admitting: Nurse Practitioner

## 2016-05-27 LAB — LYME AB/WESTERN BLOT REFLEX: B burgdorferi Ab IgG+IgM: <0.9 Index (ref <0.90)

## 2016-05-29 ENCOUNTER — Other Ambulatory Visit: Payer: Self-pay | Admitting: Nurse Practitioner

## 2016-05-29 ENCOUNTER — Other Ambulatory Visit: Payer: 59

## 2016-05-29 DIAGNOSIS — M791 Myalgia, unspecified site: Secondary | ICD-10-CM

## 2016-05-29 DIAGNOSIS — M2550 Pain in unspecified joint: Secondary | ICD-10-CM

## 2016-05-29 NOTE — Progress Notes (Signed)
error 

## 2016-05-29 NOTE — Addendum Note (Signed)
Addended by: Wilfred Lacy L on: 05/29/2016 04:56 PM   Modules accepted: Orders

## 2016-06-01 LAB — RHEUMATOID FACTOR

## 2016-06-03 DIAGNOSIS — L439 Lichen planus, unspecified: Secondary | ICD-10-CM | POA: Diagnosis not present

## 2016-06-03 DIAGNOSIS — D1801 Hemangioma of skin and subcutaneous tissue: Secondary | ICD-10-CM | POA: Diagnosis not present

## 2016-06-03 DIAGNOSIS — L821 Other seborrheic keratosis: Secondary | ICD-10-CM | POA: Diagnosis not present

## 2016-06-03 DIAGNOSIS — D485 Neoplasm of uncertain behavior of skin: Secondary | ICD-10-CM | POA: Diagnosis not present

## 2016-06-03 DIAGNOSIS — D235 Other benign neoplasm of skin of trunk: Secondary | ICD-10-CM | POA: Diagnosis not present

## 2016-06-11 DIAGNOSIS — M7662 Achilles tendinitis, left leg: Secondary | ICD-10-CM | POA: Diagnosis not present

## 2016-06-17 ENCOUNTER — Encounter: Payer: Self-pay | Admitting: Nurse Practitioner

## 2016-06-19 ENCOUNTER — Other Ambulatory Visit (INDEPENDENT_AMBULATORY_CARE_PROVIDER_SITE_OTHER): Payer: 59

## 2016-06-19 DIAGNOSIS — E782 Mixed hyperlipidemia: Secondary | ICD-10-CM

## 2016-06-19 DIAGNOSIS — Z8249 Family history of ischemic heart disease and other diseases of the circulatory system: Secondary | ICD-10-CM | POA: Diagnosis not present

## 2016-06-19 DIAGNOSIS — M7662 Achilles tendinitis, left leg: Secondary | ICD-10-CM | POA: Diagnosis not present

## 2016-06-19 LAB — LIPID PANEL
CHOLESTEROL: 281 mg/dL — AB (ref 0–200)
HDL: 65.7 mg/dL (ref >39.00)
LDL Cholesterol: 179 mg/dL — ABNORMAL HIGH (ref 0–99)
NonHDL: 215.21
Total CHOL/HDL Ratio: 4
Triglycerides: 180 mg/dL — ABNORMAL HIGH (ref 0.0–149.0)
VLDL: 36 mg/dL (ref 0.0–40.0)

## 2016-06-19 LAB — HEPATIC FUNCTION PANEL
ALT: 17 U/L (ref 0–35)
AST: 19 U/L (ref 0–37)
Albumin: 4.2 g/dL (ref 3.5–5.2)
Alkaline Phosphatase: 63 U/L (ref 39–117)
BILIRUBIN DIRECT: 0.1 mg/dL (ref 0.0–0.3)
TOTAL PROTEIN: 7 g/dL (ref 6.0–8.3)
Total Bilirubin: 0.5 mg/dL (ref 0.2–1.2)

## 2016-06-24 ENCOUNTER — Ambulatory Visit (INDEPENDENT_AMBULATORY_CARE_PROVIDER_SITE_OTHER): Payer: 59 | Admitting: Internal Medicine

## 2016-06-24 ENCOUNTER — Encounter: Payer: Self-pay | Admitting: Internal Medicine

## 2016-06-24 ENCOUNTER — Other Ambulatory Visit (INDEPENDENT_AMBULATORY_CARE_PROVIDER_SITE_OTHER): Payer: 59

## 2016-06-24 VITALS — BP 134/84 | HR 79 | Temp 97.6°F | Resp 16 | Ht 63.0 in | Wt 142.0 lb

## 2016-06-24 DIAGNOSIS — J339 Nasal polyp, unspecified: Secondary | ICD-10-CM | POA: Diagnosis not present

## 2016-06-24 DIAGNOSIS — M7662 Achilles tendinitis, left leg: Secondary | ICD-10-CM | POA: Diagnosis not present

## 2016-06-24 DIAGNOSIS — J4520 Mild intermittent asthma, uncomplicated: Secondary | ICD-10-CM

## 2016-06-24 DIAGNOSIS — E782 Mixed hyperlipidemia: Secondary | ICD-10-CM

## 2016-06-24 DIAGNOSIS — M2550 Pain in unspecified joint: Secondary | ICD-10-CM | POA: Diagnosis not present

## 2016-06-24 LAB — CBC WITH DIFFERENTIAL/PLATELET
BASOS PCT: 1 % (ref 0.0–3.0)
Basophils Absolute: 0.1 10*3/uL (ref 0.0–0.1)
EOS PCT: 6.8 % — AB (ref 0.0–5.0)
Eosinophils Absolute: 0.4 10*3/uL (ref 0.0–0.7)
HEMATOCRIT: 40.3 % (ref 36.0–46.0)
Hemoglobin: 13.7 g/dL (ref 12.0–15.0)
LYMPHS PCT: 25.8 % (ref 12.0–46.0)
Lymphs Abs: 1.4 10*3/uL (ref 0.7–4.0)
MCHC: 33.9 g/dL (ref 30.0–36.0)
MCV: 93.9 fl (ref 78.0–100.0)
MONOS PCT: 8.3 % (ref 3.0–12.0)
Monocytes Absolute: 0.4 10*3/uL (ref 0.1–1.0)
Neutro Abs: 3.1 10*3/uL (ref 1.4–7.7)
Neutrophils Relative %: 58.1 % (ref 43.0–77.0)
Platelets: 298 10*3/uL (ref 150.0–400.0)
RBC: 4.3 Mil/uL (ref 3.87–5.11)
RDW: 12.1 % (ref 11.5–15.5)
WBC: 5.3 10*3/uL (ref 4.0–10.5)

## 2016-06-24 LAB — COMPREHENSIVE METABOLIC PANEL
ALBUMIN: 4.4 g/dL (ref 3.5–5.2)
ALK PHOS: 71 U/L (ref 39–117)
ALT: 20 U/L (ref 0–35)
AST: 24 U/L (ref 0–37)
BUN: 13 mg/dL (ref 6–23)
CALCIUM: 10.1 mg/dL (ref 8.4–10.5)
CHLORIDE: 104 meq/L (ref 96–112)
CO2: 30 mEq/L (ref 19–32)
CREATININE: 0.8 mg/dL (ref 0.40–1.20)
GFR: 79.33 mL/min (ref >60.00)
Glucose, Bld: 128 mg/dL — ABNORMAL HIGH (ref 70–99)
POTASSIUM: 3.4 meq/L — AB (ref 3.5–5.1)
SODIUM: 140 meq/L (ref 135–145)
TOTAL PROTEIN: 7.2 g/dL (ref 6.0–8.3)
Total Bilirubin: 0.4 mg/dL (ref 0.2–1.2)

## 2016-06-24 LAB — SEDIMENTATION RATE: SED RATE: 17 mm/h (ref 0–30)

## 2016-06-24 LAB — C-REACTIVE PROTEIN: CRP: 0.2 mg/dL — AB (ref 0.5–20.0)

## 2016-06-24 LAB — CK: Total CK: 90 U/L (ref 7–177)

## 2016-06-24 MED ORDER — BUDESONIDE-FORMOTEROL FUMARATE 160-4.5 MCG/ACT IN AERO: 2.0000 | INHALATION_SPRAY | Freq: Two times a day (BID) | RESPIRATORY_TRACT | 11 refills | Status: DC

## 2016-06-24 NOTE — Assessment & Plan Note (Signed)
Sounds inflammatory No obvious inflammation or swelling on exam ? Related to crestor - stopped crestor and pain has not improved ? Reactive arthritis  ? Autoimmune RA neg, Lyme neg, ESR neg Will check cbc, cmp, ana, ck, esr and crp Consider prednisone taper Consider rheumatology referral

## 2016-06-24 NOTE — Assessment & Plan Note (Signed)
Was on crestor for a few weeks - stopped due to joint pain - still having joint pain Has improved cholesterol with diet changes and exercise Will continue lifestyle control for now until joint pain resolves Will consider trying a different statin in the future

## 2016-06-24 NOTE — Progress Notes (Signed)
Pre visit review using our clinic review tool, if applicable. No additional management support is needed unless otherwise documented below in the visit note. 

## 2016-06-24 NOTE — Progress Notes (Signed)
Subjective:    Patient ID: Tara Carpenter, female    DOB: April 14, 1961, 56 y.o.   MRN: [id number]  HPI  She is here to establish with a new pcp.  The patient is here for follow up.  Asthma:  She uses the symbicort as needed.  She uses her albuterol as needed.  She has had some increased cough and wheeze in the past month or so.  She denies SOB.  She denies an infection.   Hyperlipidemia:  Started crestor in October.  All of her joints started to hurt after six weeks after starting the medication.  She stopped the medication in December and she did not have any improvement.  She still has joint pain and joint swelling.  Sometimes her joints are visibly swollen, other times they just feel swollen.  She has tried CO Q10 and tumeric and they have not helped.  Her joints feel are inflammed.  She has nubmness/tingling in her hands sometimes when she wakes up - this is also new.  All joints except hips pretty much hurt.  She is stiff in the morning and that lasts less than one hour.  She has to go down stairs one foot at a time.  She never had arthritis prior to this.  She is still able to exercise regularly.  She has not take any nsaids - she wants to know the cause.   She had sinus surgery Nov 9th. She denies any infections prior to the joint pain starting.  She had a sinus infection and took an antibiotic in September.   Medications and allergies reviewed with patient and updated if appropriate.  Patient Active Problem List   Diagnosis Date Noted  . Easy bruising 01/05/2015  . Dyspnea 08/07/2014  . Family hx of colon cancer 04/18/2012  . Osteopenia 02/10/2012  . Hyperlipidemia 05/29/2011  . Family history of ischemic heart disease 03/24/2011  . Sinusitis, chronic 12/01/2007  . Asthma 12/01/2007  . DIABETES MELLITUS, GESTATIONAL, HX OF 12/01/2007    Current Outpatient Prescriptions on File Prior to Visit  Medication Sig Dispense Refill  . budesonide (PULMICORT) 0.25 MG/2ML nebulizer  solution Take 0.25 mg by nebulization 2 (two) times daily.    . budesonide-formoterol (SYMBICORT) 160-4.5 MCG/ACT inhaler Inhale 2 puffs into the lungs 2 (two) times daily. 1 Inhaler 11  . Calcium Carbonate-Vitamin D (CALCIUM + D PO) Take 1 tablet by mouth daily.     . Cetirizine HCl (ZYRTEC ALLERGY PO) Take 1 tablet by mouth daily.     . fenofibrate (TRICOR) 145 MG tablet Take 1 tablet (145 mg total) by mouth daily. 30 tablet 3  . meloxicam (MOBIC) 15 MG tablet Take 1 tablet (15 mg total) by mouth daily as needed for pain. With food 30 tablet 0  . montelukast (SINGULAIR) 10 MG tablet TK 1 T PO HS  11  . Montelukast Sodium (SINGULAIR PO) Take 1 tablet by mouth daily.     . Multiple Vitamin (MULTIVITAMIN) capsule Take 1 capsule by mouth daily.      Marland Kitchen omega-3 acid ethyl esters (LOVAZA) 1 g capsule Take 2 capsules (2 g total) by mouth 2 (two) times daily. 60 capsule 3   No current facility-administered medications on file prior to visit.     Past Medical History:  Diagnosis Date  . Colon polyps 02/20/2010   Tubular adenoma, Hyperplastic  . Rosacea     Past Surgical History:  Procedure Laterality Date  . NASAL SINUS SURGERY  x 3  . TEMPOROMANDIBULAR JOINT SURGERY      Social History   Social History  . Marital status: Married    Spouse name: N/A  . Number of children: N/A  . Years of education: N/A   Social History Main Topics  . Smoking status: Never Smoker  . Smokeless tobacco: Never Used  . Alcohol use 3.6 oz/week    6 Standard drinks or equivalent per week  . Drug use: No  . Sexual activity: Yes    Birth control/ protection: Post-menopausal     Comment: Vasectomy-1st intercourse 56 yo-Fewer than 5 partners   Other Topics Concern  . None   Social History Narrative  . None    Family History  Problem Relation Age of Onset  . Diabetes Mother   . Colon cancer Mother   . Diabetes Father   . Heart disease Father     Review of Systems  Constitutional: Negative  for chills, fatigue and fever.  Eyes: Negative for visual disturbance.  Respiratory: Positive for cough (asthma related) and wheezing (asthma related). Negative for shortness of breath.   Cardiovascular: Negative for chest pain, palpitations and leg swelling.  Gastrointestinal: Negative for abdominal pain, blood in stool, constipation, diarrhea and nausea.  Genitourinary: Negative for dysuria and hematuria.  Musculoskeletal: Positive for arthralgias (diffuse, except hips, ) and joint swelling. Negative for myalgias.  Neurological: Positive for numbness (hands). Negative for light-headedness and headaches.       Objective:   Vitals:   06/24/16 1447  BP: 134/84  Pulse: 79  Resp: 16  Temp: 97.6 F (36.4 C)   Wt Readings from Last 3 Encounters:  06/24/16 142 lb (64.4 kg)  05/13/16 142 lb (64.4 kg)  03/20/16 141 lb (64 kg)   Body mass index is 25.15 kg/m.   Physical Exam    Constitutional: Appears well-developed and well-nourished. No distress.  HENT:  Head: Normocephalic and atraumatic.  Neck: Neck supple. No tracheal deviation present. No thyromegaly present.  No cervical lymphadenopathy Cardiovascular: Normal rate, regular rhythm and normal heart sounds.   No murmur heard. No carotid bruit .  No edema Pulmonary/Chest: Effort normal and breath sounds normal. No respiratory distress. No has no wheezes. No rales.  Msk; no joint deformities or swelling on exam.  No synovitis. Increased joint pain with movement of joints. Skin: Skin is warm and dry. Not diaphoretic.  Psychiatric: Normal mood and affect. Behavior is normal.      Assessment & Plan:   See Problem List for Assessment and Plan of chronic medical problems.

## 2016-06-24 NOTE — Assessment & Plan Note (Signed)
Uses symbicort as needed - not daily Take singulair daily Increased wheeze/cough  - will start symbicort

## 2016-06-24 NOTE — Patient Instructions (Addendum)
  Test(s) ordered today. Your results will be released to MyChart (or called to you) after review, usually within 72hours after test completion. If any changes need to be made, you will be notified at that same time.   No immunizations administered today.   Medications reviewed and updated.  /  No changes recommended at this time.    

## 2016-06-25 ENCOUNTER — Encounter: Payer: Self-pay | Admitting: Internal Medicine

## 2016-06-25 DIAGNOSIS — M2550 Pain in unspecified joint: Secondary | ICD-10-CM

## 2016-06-25 LAB — ANA: Anti Nuclear Antibody(ANA): NEGATIVE

## 2016-06-26 DIAGNOSIS — M7662 Achilles tendinitis, left leg: Secondary | ICD-10-CM | POA: Diagnosis not present

## 2016-06-26 MED ORDER — PREDNISONE 10 MG PO TABS: ORAL_TABLET | ORAL | 0 refills | Status: DC

## 2016-07-01 DIAGNOSIS — M7662 Achilles tendinitis, left leg: Secondary | ICD-10-CM | POA: Diagnosis not present

## 2016-07-03 DIAGNOSIS — M7662 Achilles tendinitis, left leg: Secondary | ICD-10-CM | POA: Diagnosis not present

## 2016-07-08 DIAGNOSIS — M7662 Achilles tendinitis, left leg: Secondary | ICD-10-CM | POA: Diagnosis not present

## 2016-07-10 DIAGNOSIS — M7662 Achilles tendinitis, left leg: Secondary | ICD-10-CM | POA: Diagnosis not present

## 2016-07-17 ENCOUNTER — Encounter: Payer: Self-pay | Admitting: Women's Health

## 2016-07-17 DIAGNOSIS — Z1231 Encounter for screening mammogram for malignant neoplasm of breast: Secondary | ICD-10-CM | POA: Diagnosis not present

## 2016-07-27 DIAGNOSIS — R5383 Other fatigue: Secondary | ICD-10-CM | POA: Diagnosis not present

## 2016-07-27 DIAGNOSIS — M79641 Pain in right hand: Secondary | ICD-10-CM | POA: Diagnosis not present

## 2016-07-27 DIAGNOSIS — M791 Myalgia: Secondary | ICD-10-CM | POA: Diagnosis not present

## 2016-07-27 DIAGNOSIS — M2550 Pain in unspecified joint: Secondary | ICD-10-CM | POA: Diagnosis not present

## 2016-08-28 DIAGNOSIS — M791 Myalgia: Secondary | ICD-10-CM | POA: Diagnosis not present

## 2016-08-28 DIAGNOSIS — R5383 Other fatigue: Secondary | ICD-10-CM | POA: Diagnosis not present

## 2016-08-28 DIAGNOSIS — M2550 Pain in unspecified joint: Secondary | ICD-10-CM | POA: Diagnosis not present

## 2016-10-07 ENCOUNTER — Encounter: Payer: Self-pay | Admitting: Gynecology

## 2016-10-07 DIAGNOSIS — J324 Chronic pansinusitis: Secondary | ICD-10-CM | POA: Diagnosis not present

## 2016-10-07 DIAGNOSIS — J339 Nasal polyp, unspecified: Secondary | ICD-10-CM | POA: Diagnosis not present

## 2016-10-07 DIAGNOSIS — J4530 Mild persistent asthma, uncomplicated: Secondary | ICD-10-CM | POA: Diagnosis not present

## 2016-11-10 ENCOUNTER — Other Ambulatory Visit: Payer: Self-pay | Admitting: Internal Medicine

## 2016-11-10 NOTE — Telephone Encounter (Signed)
She needs to be seen if she thinks she needs steroids

## 2016-11-10 NOTE — Telephone Encounter (Signed)
Pease advise.

## 2016-11-27 DIAGNOSIS — M2550 Pain in unspecified joint: Secondary | ICD-10-CM | POA: Diagnosis not present

## 2016-11-27 DIAGNOSIS — M791 Myalgia: Secondary | ICD-10-CM | POA: Diagnosis not present

## 2016-11-27 DIAGNOSIS — R5383 Other fatigue: Secondary | ICD-10-CM | POA: Diagnosis not present

## 2016-11-27 DIAGNOSIS — M25561 Pain in right knee: Secondary | ICD-10-CM | POA: Diagnosis not present

## 2017-01-04 ENCOUNTER — Encounter: Payer: Self-pay | Admitting: Internal Medicine

## 2017-02-21 IMAGING — US US SOFT TISSUE HEAD/NECK
1 series · 14 of 25 positions shown · non-contrast
Comparison: None.

CLINICAL DATA: Anterior neck swelling

EXAM:
THYROID ULTRASOUND
TECHNIQUE: Ultrasound examination of the thyroid gland and adjacent soft
tissues was performed.

[Series 1: us soft tissue head/neck · 0.06mm/px · 14 of 57 slices shown]
[im 1/57]
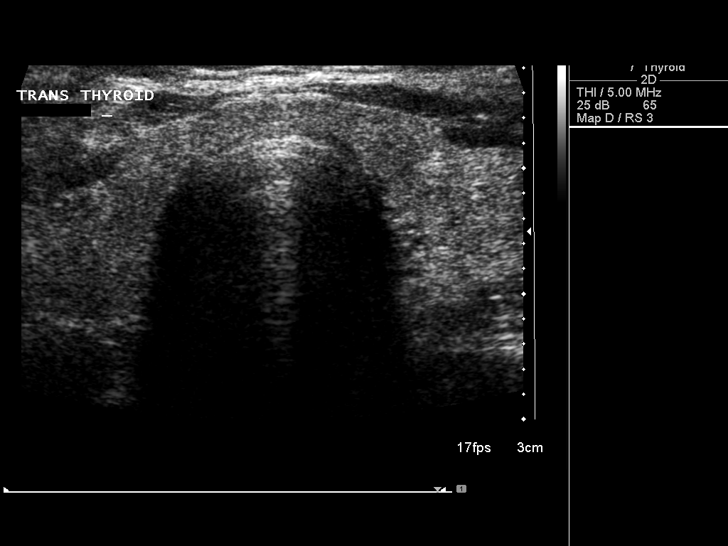
[im 5/57]
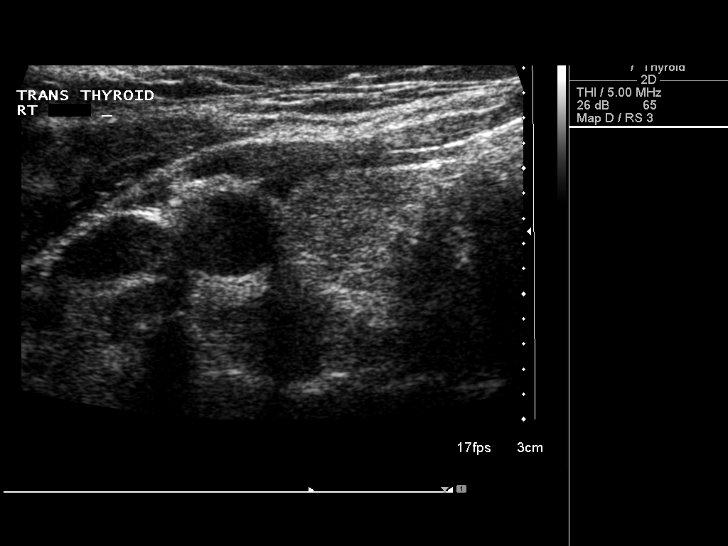
[im 10/57]
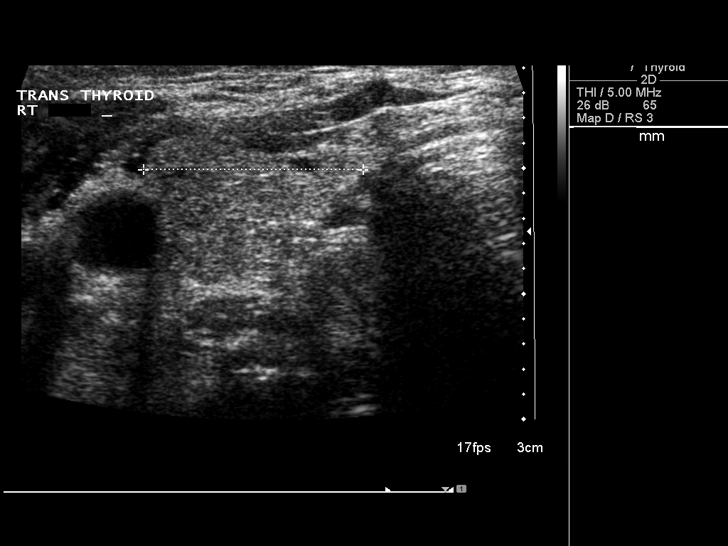
[im 15/57]
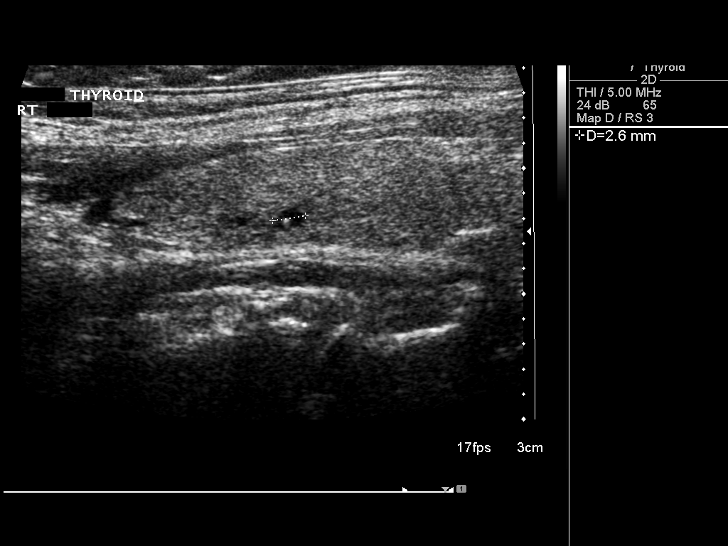
[im 19/57]
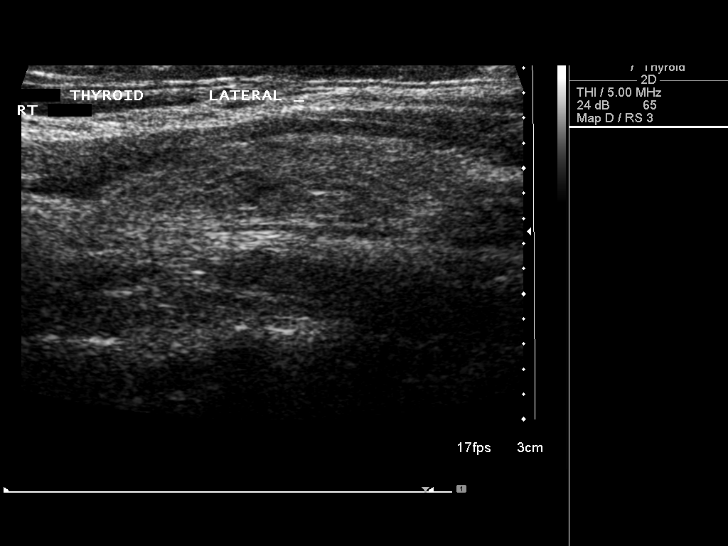
[im 22/57]
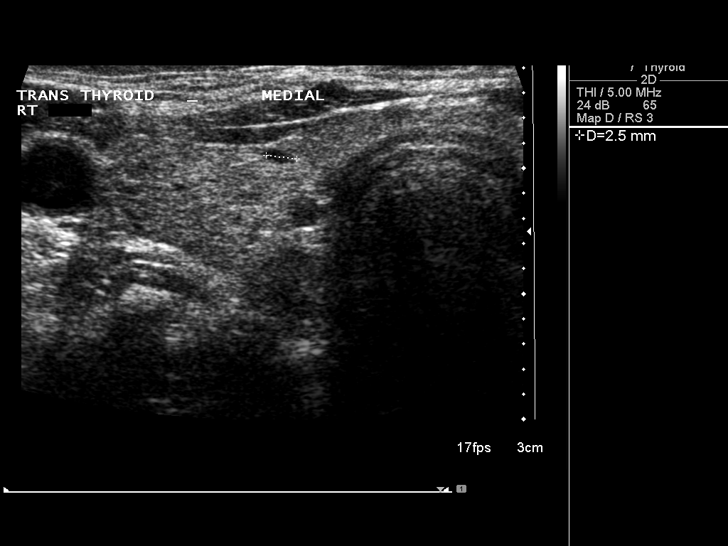
[im 26/57]
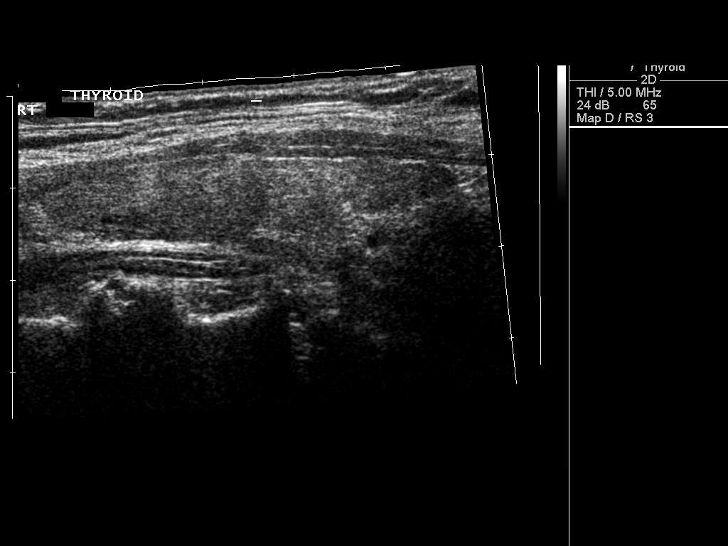
[im 31/57]
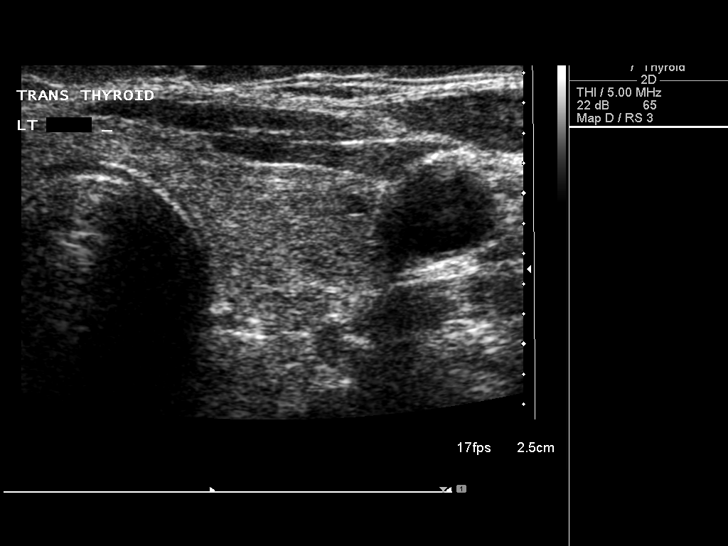
[im 36/57]
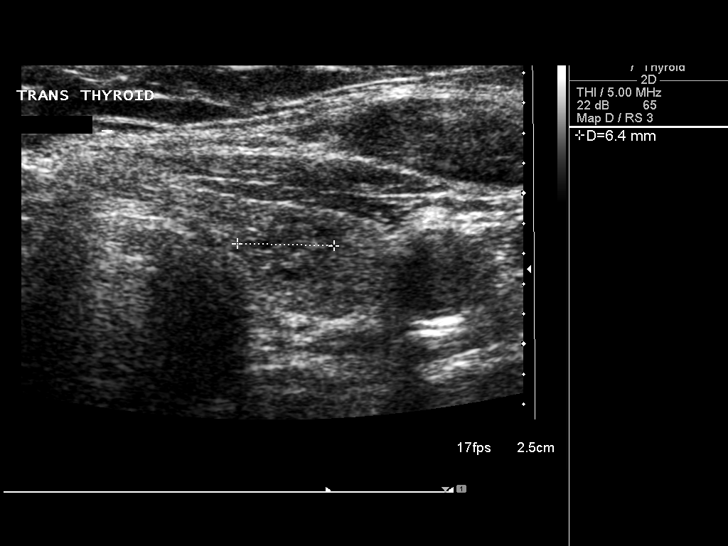
[im 38/57]
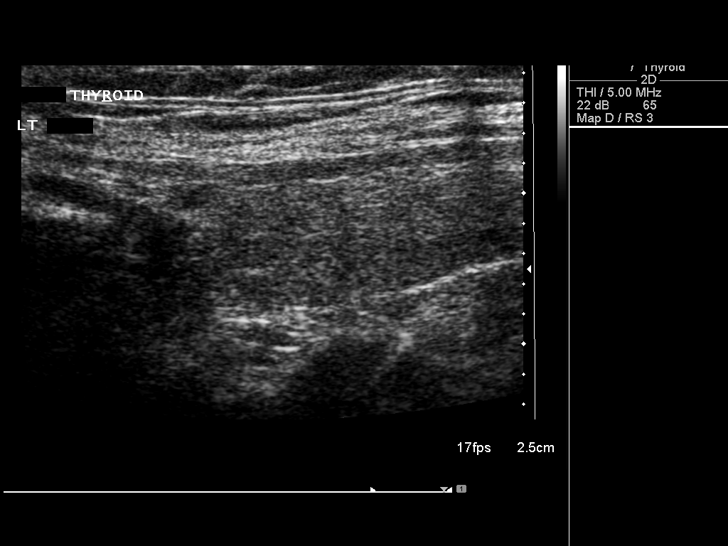
[im 43/57]
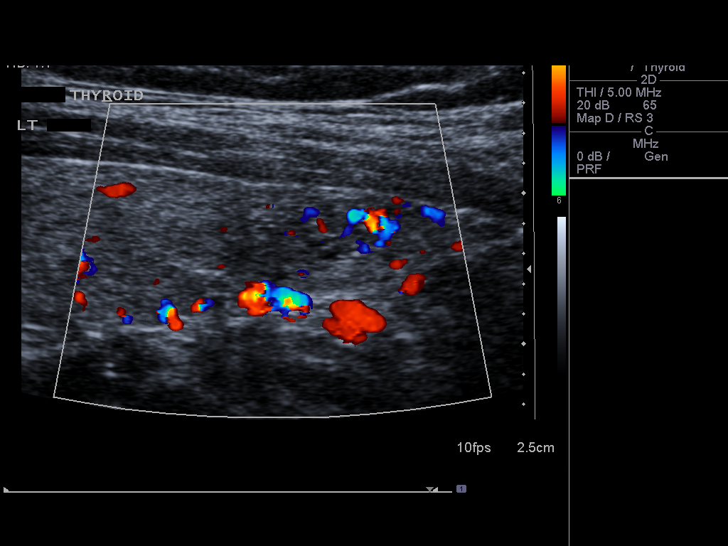
[im 47/57]
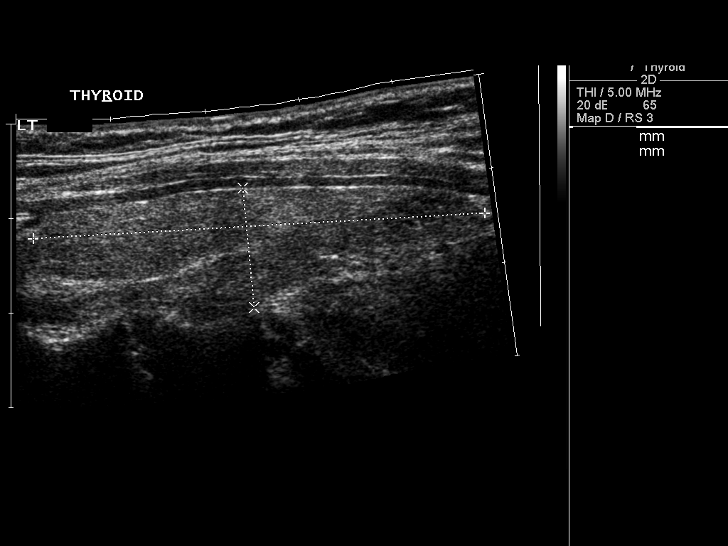
[im 52/57]
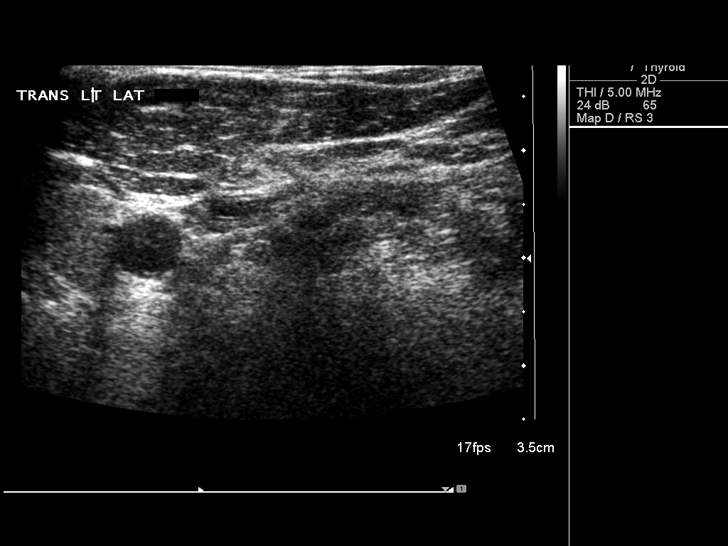
[im 57/57]
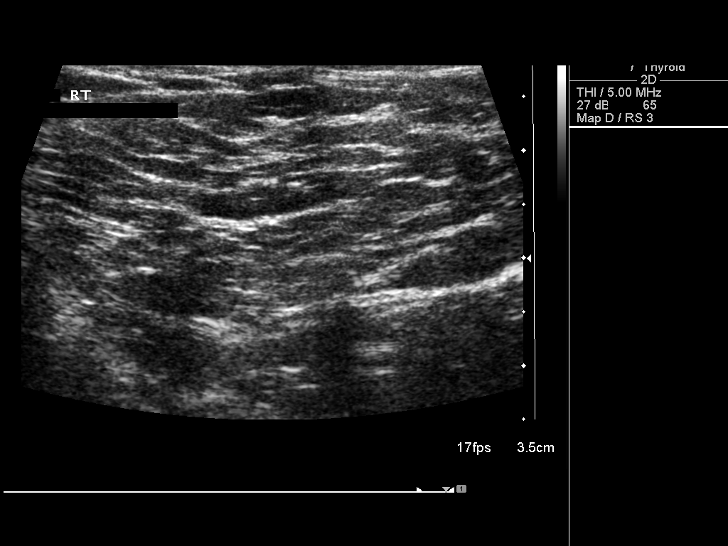

[14 of 25 positions shown; findings below may reference images not displayed]

FINDINGS: Right thyroid lobe

Measurements: 45 x 12 x 18 mm. Somewhat heterogeneous background
echotexture. Several small complex cysts, all less than 3 mm.

Left thyroid lobe

Measurements: 48 x 13 x 13 mm. 7 x 4 x 6 mm hypoechoic nodule,
inferior pole. Mild hyperemia.

Isthmus

Thickness: 2 mm.  No nodules visualized.

Lymphadenopathy

None visualized.
IMPRESSION: 1. Normal-sized thyroid with a small left nodule. Findings do not
meet current consensus criteria for biopsy. Follow-up by clinical
exam is recommended. If patient has known risk factors for thyroid
carcinoma, consider follow-up ultrasound in 12 months. If patient is
clinically hyperthyroid, consider nuclear medicine thyroid uptake
and scan. This recommendation follows the consensus statement:
Management of Thyroid Nodules Detected as US: Society of
Radiologists in Ultrasound Consensus Conference Statement. Radiology

## 2017-03-26 DIAGNOSIS — M2042 Other hammer toe(s) (acquired), left foot: Secondary | ICD-10-CM | POA: Diagnosis not present

## 2017-03-26 DIAGNOSIS — Q6621 Congenital metatarsus primus varus: Secondary | ICD-10-CM | POA: Diagnosis not present

## 2017-03-26 DIAGNOSIS — M6588 Other synovitis and tenosynovitis, other site: Secondary | ICD-10-CM | POA: Diagnosis not present

## 2017-04-01 ENCOUNTER — Encounter: Payer: Self-pay | Admitting: Internal Medicine

## 2017-04-01 ENCOUNTER — Ambulatory Visit (INDEPENDENT_AMBULATORY_CARE_PROVIDER_SITE_OTHER): Payer: 59 | Admitting: Internal Medicine

## 2017-04-01 ENCOUNTER — Other Ambulatory Visit (INDEPENDENT_AMBULATORY_CARE_PROVIDER_SITE_OTHER): Payer: 59

## 2017-04-01 DIAGNOSIS — J45991 Cough variant asthma: Secondary | ICD-10-CM

## 2017-04-01 LAB — CBC WITH DIFFERENTIAL/PLATELET
BASOS PCT: 1.2 % (ref 0.0–3.0)
Basophils Absolute: 0.1 10*3/uL (ref 0.0–0.1)
EOS ABS: 0.4 10*3/uL (ref 0.0–0.7)
EOS PCT: 6.1 % — AB (ref 0.0–5.0)
HEMATOCRIT: 41.3 % (ref 36.0–46.0)
HEMOGLOBIN: 13.9 g/dL (ref 12.0–15.0)
LYMPHS PCT: 30 % (ref 12.0–46.0)
Lymphs Abs: 2 10*3/uL (ref 0.7–4.0)
MCHC: 33.6 g/dL (ref 30.0–36.0)
MCV: 97.5 fl (ref 78.0–100.0)
MONO ABS: 0.5 10*3/uL (ref 0.1–1.0)
Monocytes Relative: 7 % (ref 3.0–12.0)
Neutro Abs: 3.6 10*3/uL (ref 1.4–7.7)
Neutrophils Relative %: 55.7 % (ref 43.0–77.0)
Platelets: 273 10*3/uL (ref 150.0–400.0)
RBC: 4.24 Mil/uL (ref 3.87–5.11)
RDW: 12.1 % (ref 11.5–15.5)
WBC: 6.6 10*3/uL (ref 4.0–10.5)

## 2017-04-01 LAB — NITRIC OXIDE: Nitric Oxide: 62

## 2017-04-01 MED ORDER — MOMETASONE FURO-FORMOTEROL FUM 100-5 MCG/ACT IN AERO: 2.0000 | INHALATION_SPRAY | Freq: Two times a day (BID) | RESPIRATORY_TRACT | 11 refills | Status: DC

## 2017-04-01 MED ORDER — MOMETASONE FURO-FORMOTEROL FUM 100-5 MCG/ACT IN AERO: 2.0000 | INHALATION_SPRAY | Freq: Two times a day (BID) | RESPIRATORY_TRACT | 0 refills | Status: DC

## 2017-04-01 NOTE — Assessment & Plan Note (Addendum)
FENO 04/01/2017  =   62  -  04/01/2017  After extensive coaching HFA effectiveness =    90% > try dulera 100 2bid and gerd rx for acute flares only not as maint  - Allergy profile 04/01/2017 >  Eos 0.4 /  IgE pending   The most common causes of chronic cough in immunocompetent adults include the following: upper airway cough syndrome (UACS), previously referred to as postnasal drip syndrome (PNDS), which is caused by variety of rhinosinus conditions; (2) asthma; (3) GERD; (4) chronic bronchitis from cigarette smoking or other inhaled environmental irritants; (5) nonasthmatic eosinophilic bronchitis; and (6) bronchiectasis.   These conditions, singly or in combination, have accounted for up to 94% of the causes of chronic cough in prospective studies.   Other conditions have constituted no >6% of the causes in prospective studies These have included bronchogenic carcinoma, chronic interstitial pneumonia, sarcoidosis, left ventricular failure, ACEI-induced cough, and aspiration from a condition associated with pharyngeal dysfunction.    Chronic cough is often simultaneously caused by more than one condition. A single cause has been found from 38 to 82% of the time, multiple causes from 18 to 62%. Multiply caused cough has been the result of three diseases up to 42% of the time.    I believe the cough has multiple causes as the dysphagia and throat clearing are not typical for cough variant asthma but more likely due to Upper airway cough syndrome (previously labeled PNDS),  is so named because it's frequently impossible to sort out how much is  CR/sinusitis with freq throat clearing (which can be related to primary GERD)   vs  causing  secondary (" extra esophageal")  GERD from wide swings in gastric pressure that occur with throat clearing, often  promoting self use of mint and menthol lozenges that reduce the lower esophageal sphincter tone and exacerbate the problem further in a cyclical fashion.    These are the same pts (now being labeled as having "irritable larynx syndrome" by some cough centers) who not infrequently have a history of having failed to tolerate ace inhibitors,  dry powder inhalers or biphosphonates or report having atypical/extraesophageal reflux symptoms that don't respond to standard doses of PPI  and are easily confused as having aecopd or asthma flares by even experienced allergists/ pulmonologists (myself included).   Rec rx cough variant asthma with dulera 100 2bid and singulair but should not need to maintain on dulera long term based on new data:  Based on the study from NEJM  378; 20 p 1865 (2018) in pts with mild asthma it is reasonable to use low dose symbicort or dulera    "prn" flare in this setting but I emphasized this was only shown with symbicort and takes advantage of the rapid onset of action but is not the same as "rescue therapy" but can be stopped once the acute symptoms have resolved and the need for rescue has been minimized (< 2 x weekly)     To rx uacs rec max gerd rx  And f/u in 2 weeks if not all better   I had an extended discussion with the patient reviewing all relevant studies completed to date and  lasting 25 minutes of a 40  minute acute visit to re-establish     re  severe non-specific but potentially very serious refractory respiratory symptoms of uncertain and potentially multiple  etiologies.   Each maintenance medication was reviewed in detail including most importantly the difference between maintenance  and prns and under what circumstances the prns are to be triggered using an action plan format that is not reflected in the computer generated alphabetically organized AVS.    Please see AVS for specific instructions unique to this office visit that I personally wrote and verbalized to the the pt in detail and then reviewed with pt  by my nurse highlighting any changes in therapy/plan of care  recommended at today's visit.

## 2017-04-01 NOTE — Progress Notes (Signed)
Subjective:     Patient ID: Tara Carpenter, female   DOB: 1960-07-22,    MRN: [id number]    Brief patient profile:  37 yowf never smoker onset of asthma 2nd pregancy 1st trimester manifested by cough better with prednisone / inhalers allergy w/u by Tara Carpenter pos trees/grasses  And ? Better p  Shots not clear but able to stop all meds with no flare though  some sinus issues = nasal polyposis  >>4  surgeries redman/ lee/matthews last 2017  maintained on  singulair and rare need for albuterol but since the  referred by Dr Tara Carpenter to pulmonary clinic 08/07/2014 for recurrent cough     History of Present Illness  08/07/2014 1st [facility] office visit/ Tara Carpenter   Chief Complaint  Patient presents with  . Pulmonary Consult    Referred by Dr. Unice Carpenter. Pt states that she was dxed with asthma during her second pregnancy in 1995. She c/o wheezing and "hard to take in a deep breath" since Jan 2016.    in Dec 2015 started noticing need for albuterol several times per week helped a lot but symbicort did not work well  Then prednisone improved by 75% still not able to take full breath, occ cough  Dry day > noct mostly dry  Assoc with more sinus problems than usual  Back to exercising but tolerance not back to nl for her  Ok at hs  rec GERD diet  Work on Product manager inhaler technique:    If getting worse > add Try prilosec 20mg   Take 30-60 min before first meal of the day and Pepcid 20 mg one bedtime   Please remember to go to the  x-ray department downstairs for your tests - we will call you with the results when they are available. If not better see me  after a month, if 100% better ok to taper symbicort after a month    04/01/2017  Acute ext f/u ov/Amiree No re: re establish re recurrent cough  Chief Complaint  Patient presents with  . Acute Visit    Pt c/o SOB, PND, and cough since end of Sept 2018. She states feels like there is something in her throat. She had some pred on hand and tried taking  this w/o much relief.  She is using her proair inhaler 2-3 x per day on average.   acute onset late Sept 2018 recurrent cough while maint on singulair   worse noct sev hours p hs and always  dry assoc with excessive throat cleairng  Prednisone may be 25% / symbicort did not help this time   No assoc nasal symptoms  Tara Carpenter helps a little but Not limited by breathing from desired activities     No obvious patterns to day to day or daytime variability or assoc excess/ purulent sputum or mucus plugs or hemoptysis or cp or chest tightness, subjective wheeze or overt sinus or hb symptoms. No unusual exp hx or h/o childhood pna/ asthma or knowledge of premature birth.   Also denies any obvious fluctuation of symptoms with weather or environmental changes or other aggravating or alleviating factors except as outlined above   Current Allergies, Complete Past Medical History, Past Surgical History, Family History, and Social History were reviewed in [employer] record.  ROS  The following are not active complaints unless bolded Hoarseness, sore throat, dysphagia, dental problems, itching, sneezing,  nasal congestion or discharge of excess mucus or purulent secretions, ear ache,   fever,  chills, sweats, unintended wt loss or wt gain, classically pleuritic or exertional cp,  orthopnea pnd or leg swelling, presyncope, palpitations, abdominal pain, anorexia, nausea, vomiting, diarrhea  or change in bowel habits or change in bladder habits, change in stools or change in urine, dysuria, hematuria,  rash, arthralgias, visual complaints, headache, numbness, weakness or ataxia or problems with walking or coordination,  change in mood/affect or memory.        Current Meds  Medication Sig  . albuterol (PROAIR HFA) 108 (90 Base) MCG/ACT inhaler Inhale 1-2 puffs every 6 (six) hours as needed into the lungs for wheezing or shortness of breath.  . budesonide (PULMICORT) 0.25 MG/2ML nebulizer solution  Take 0.25 mg daily by nebulization.   . Calcium Carbonate-Vitamin D (CALCIUM + D PO) Take 1 tablet by mouth daily.   . Cetirizine HCl (ZYRTEC ALLERGY PO) Take 1 tablet by mouth daily.   . Montelukast Sodium (SINGULAIR PO) Take 1 tablet by mouth daily.   . Multiple Vitamin (MULTIVITAMIN) capsule Take 1 capsule by mouth daily.    Marland Kitchen omega-3 acid ethyl esters (LOVAZA) 1 g capsule Take 2 capsules (2 g total) by mouth 2 (two) times daily.             Objective:   Physical Exam  amb wf nad  04/01/2017       140    08/07/14 139 lb (63.05 kg)  06/06/14 142 lb (64.411 kg)  02/16/14 136 lb (61.689 kg)    Vital signs reviewed  - Note on arrival 02 sats  98% on RA    HEENT: nl dentition, turbinates bilaterally, and oropharynx. Nl external ear canals without cough reflex   NECK :  without JVD/Nodes/TM/ nl carotid upstrokes bilaterally   LUNGS: no acc muscle use,  Nl contour chest which is clear to A and P bilaterally without cough on insp or exp maneuvers   CV:  RRR  no s3 or murmur or increase in P2, and no edema   ABD:  soft and nontender with nl inspiratory excursion in the supine position. No bruits or organomegaly appreciated, bowel sounds nl  MS:  Nl gait/ ext warm without deformities, calf tenderness, cyanosis or clubbing No obvious joint restrictions   SKIN: warm and dry without lesions    NEURO:  alert, approp, nl sensorium with  no motor or cerebellar deficits apparent.     Labs ordered 04/01/2017   Allergy profile             Assessment:

## 2017-04-01 NOTE — Patient Instructions (Addendum)
Try prilosec otc 20mg   Take 30-60 min before first meal of the day and Pepcid ac (famotidine) 20 mg one @  bedtime until cough is completely gone for at least a week without the need for cough suppression   GERD (REFLUX)  is an extremely common cause of respiratory symptoms just like yours , many times with no obvious heartburn at all.    It can be treated with medication, but also with lifestyle changes including elevation of the head of your bed (ideally with 6 inch  bed blocks),  Smoking cessation, avoidance of late meals, excessive alcohol, and avoid fatty foods, chocolate, peppermint, colas, red wine, and acidic juices such as orange juice.  NO MINT OR MENTHOL PRODUCTS SO NO COUGH DROPS   USE SUGARLESS CANDY INSTEAD (Jolley ranchers or Stover's or Life Savers) or even ice chips will also do - the key is to swallow to prevent all throat clearing. NO OIL BASED VITAMINS - use powdered substitutes.  dulera 100 Take 2 puffs first thing in am and then another 2 puffs about 12 hours later - ok to take as needed after you try it out for a full 2 weeks   Please remember to go to the lab department downstairs in the basement  for your tests - we will call you with the results when they are available.  Follow up if not 100% satisfied

## 2017-04-02 ENCOUNTER — Encounter: Payer: Self-pay | Admitting: Internal Medicine

## 2017-04-02 LAB — RESPIRATORY ALLERGY PROFILE REGION II ~~LOC~~
Allergen, Cedar tree, t12: <0.1 kU/L
Allergen, D pternoyssinus,d7: <0.1 kU/L
Allergen, Mulberry, t76: <0.1 kU/L
Allergen, Oak,t7: <0.1 kU/L
Allergen, P. notatum, m1: <0.1 kU/L
Box Elder IgE: <0.1 kU/L
CLASS: 0
CLASS: 0
CLASS: 0
CLASS: 0
CLASS: 0
CLASS: 0
CLASS: 0
CLASS: 0
CLASS: 0
CLASS: 0
CLASS: 0
CLASS: 0
CLASS: 0
COMMON RAGWEED (SHORT) (W1) IGE: <0.1 kU/L
Cat Dander: <0.1 kU/L
Class: 0
Class: 0
Class: 0
Class: 0
Class: 0
Class: 0
Class: 0
Class: 0
Class: 0
Class: 0
Class: 0
Cockroach: <0.1 kU/L
D. farinae: <0.1 kU/L
Elm IgE: <0.1 kU/L
IgE (Immunoglobulin E), Serum: 36 kU/L (ref <=114)
Johnson Grass: <0.1 kU/L

## 2017-04-02 LAB — INTERPRETATION:

## 2017-04-05 ENCOUNTER — Telehealth: Payer: Self-pay | Admitting: *Deleted

## 2017-04-05 NOTE — Telephone Encounter (Signed)
-----   Message from Tanda Rockers, MD sent at 04/02/2017  4:46 PM EST ----- Allergy profile neg

## 2017-04-05 NOTE — Telephone Encounter (Signed)
Advised pt of results. Pt understood and nothing further is needed.   

## 2017-04-05 NOTE — Telephone Encounter (Signed)
LMTCB

## 2017-04-05 NOTE — Telephone Encounter (Signed)
Patient returned call, CB is [phone].

## 2017-04-07 DIAGNOSIS — J0130 Acute sphenoidal sinusitis, unspecified: Secondary | ICD-10-CM | POA: Diagnosis not present

## 2017-04-07 DIAGNOSIS — J324 Chronic pansinusitis: Secondary | ICD-10-CM | POA: Diagnosis not present

## 2017-04-07 DIAGNOSIS — J339 Nasal polyp, unspecified: Secondary | ICD-10-CM | POA: Diagnosis not present

## 2017-04-07 DIAGNOSIS — J4530 Mild persistent asthma, uncomplicated: Secondary | ICD-10-CM | POA: Diagnosis not present

## 2017-04-19 ENCOUNTER — Encounter: Payer: Self-pay | Admitting: Internal Medicine

## 2017-04-19 NOTE — Telephone Encounter (Signed)
Instructions say return in 2 weeks if not all better so that's what I recommend at this time but be sure to bring all active meds so we can regroup

## 2017-04-20 ENCOUNTER — Encounter: Payer: Self-pay | Admitting: Internal Medicine

## 2017-04-20 ENCOUNTER — Ambulatory Visit (INDEPENDENT_AMBULATORY_CARE_PROVIDER_SITE_OTHER): Payer: 59 | Admitting: Internal Medicine

## 2017-04-20 VITALS — BP 106/70 | HR 71 | Ht 62.5 in | Wt 141.0 lb

## 2017-04-20 DIAGNOSIS — J45991 Cough variant asthma: Secondary | ICD-10-CM

## 2017-04-20 MED ORDER — BUDESONIDE-FORMOTEROL FUMARATE 160-4.5 MCG/ACT IN AERO: 2.0000 | INHALATION_SPRAY | Freq: Two times a day (BID) | RESPIRATORY_TRACT | 11 refills | Status: DC

## 2017-04-20 NOTE — Patient Instructions (Addendum)
Stay on the high dose acid suppression until the cough is 100% gone   GERD (REFLUX)  is an extremely common cause of respiratory symptoms just like yours , many times with no obvious heartburn at all.    It can be treated with medication, but also with lifestyle changes including elevation of the head of your bed (ideally with 6 inch  bed blocks),  Smoking cessation, avoidance of late meals, excessive alcohol, and avoid fatty foods, chocolate, peppermint, colas, red wine, and acidic juices such as orange juice.  NO MINT OR MENTHOL PRODUCTS SO NO COUGH DROPS   USE SUGARLESS CANDY INSTEAD (Jolley ranchers or Stover's or Life Savers) or even ice chips will also do - the key is to swallow to prevent all throat clearing. NO OIL BASED VITAMINS - use powdered substitutes.  Try symbiocort 160 Take 2 puffs first thing in am and then another 2 puffs about 12 hours later to see what difference it makes and if not convinced it's helping please stop   Please schedule a follow up office visit in 6 weeks, call sooner if needed  - repeat feno next ov if still coughing

## 2017-04-20 NOTE — Progress Notes (Signed)
Subjective:     Patient ID: Tara Carpenter, female   DOB: Sep 13, 1960,    MRN: [id number]    Brief patient profile:  53 yowf never smoker onset of asthma 2nd pregancy 1st trimester manifested by cough better with prednisone / inhalers allergy w/u by Donneta Romberg pos trees/grasses  And ? Better p  Shots not clear but able to stop all meds with no flare though  some sinus issues = nasal polyposis  >>4  surgeries redman/ lee/matthews last 2017  maintained on  singulair and rare need for albuterol but since the  referred by Dr Linna Darner to pulmonary clinic 08/07/2014 for recurrent cough     History of Present Illness  08/07/2014 1st [facility] office visit/ Jocelyn Lowery   Chief Complaint  Patient presents with  . Pulmonary Consult    Referred by Dr. Unice Cobble. Pt states that she was dxed with asthma during her second pregnancy in 1995. She c/o wheezing and "hard to take in a deep breath" since Jan 2016.    in Dec 2015 started noticing need for albuterol several times per week helped a lot but symbicort did not work well  Then prednisone improved by 75% still not able to take full breath, occ cough  Dry day > noct mostly dry  Assoc with more sinus problems than usual  Back to exercising but tolerance not back to nl for her  Ok at hs  rec GERD diet  Work on Product manager inhaler technique:    If getting worse > add Try prilosec 20mg   Take 30-60 min before first meal of the day and Pepcid 20 mg one bedtime   Please remember to go to the  x-ray department downstairs for your tests - we will call you with the results when they are available. If not better see me  after a month, if 100% better ok to taper symbicort after a month    04/01/2017  Acute ext f/u ov/[name] re: re establish re recurrent cough  Chief Complaint  Patient presents with  . Acute Visit    Pt c/o SOB, PND, and cough since end of Sept 2018. She states feels like there is something in her throat. She had some pred on hand and tried taking  this w/o much relief.  She is using her proair inhaler 2-3 x per day on average.   acute onset late Sept 2018 recurrent cough while maint on singulair   worse noct sev hours p hs and always  dry assoc with excessive throat cleairng  Prednisone may be 25% / symbicort did not help this time   No assoc nasal symptoms  Laurence Ferrari helps a little but Not limited by breathing from desired activities   rec Try prilosec otc 20mg   Take 30-60 min before first meal of the day and Pepcid ac (famotidine) 20 mg one @  bedtime until cough is completely gone for at least a week without the need for cough suppression GERD diet  Dulera 100 Take 2 puffs first thing in am and then another 2 puffs about 12 hours later - ok to take as needed after you try it out for a full 2 weeks Please remember to go to the lab department downstairs in the basement  for your tests - we will call you with the results when they are available. Follow up if not 100% satisfied     04/20/2017  f/u ov/[name] re: cough since mid sept 2018  Chief Complaint  Patient presents  with  . Follow-up    Cough has only improved slightly.  She is not producing any sputum. She was seen by ENT at [facility] and was dxed with sinus infection-currently on cephalexin.   took dulera x 2 weeks and d/c 04/17/17 ? maybe a little  Day 6 abx per ent Still on pepcid 20 mg no longer on prilosec  No change  off vs back on fish oil  Totally sporadic harsh cough sev times a day / superimposed on throat clearing Cough day > noct  Laughing or cold air exp make her cough  Not limited by breathing from desired activities     No obvious day to day or daytime variability or assoc excess/ purulent sputum or mucus plugs or hemoptysis or cp or chest tightness, subjective wheeze or overt   hb symptoms. No unusual exposure hx or h/o childhood pna/ asthma or knowledge of premature birth.  Sleeping ok flat without nocturnal  or early am exacerbation  of respiratory  c/o's or need  for noct saba. Also denies any obvious fluctuation of symptoms with weather or environmental changes or other aggravating or alleviating factors except as outlined above   Current Allergies, Complete Past Medical History, Past Surgical History, Family History, and Social History were reviewed in [employer] record.  ROS  The following are not active complaints unless bolded Hoarseness, sore throat, dysphagia, dental problems, itching, sneezing,  nasal congestion or discharge of excess mucus or purulent secretions, ear ache,   fever, chills, sweats, unintended wt loss or wt gain, classically pleuritic or exertional cp,  orthopnea pnd or leg swelling, presyncope, palpitations, abdominal pain, anorexia, nausea, vomiting, diarrhea  or change in bowel habits or change in bladder habits, change in stools or change in urine, dysuria, hematuria,  rash, arthralgias, visual complaints, headache, numbness, weakness or ataxia or problems with walking or coordination,  change in mood/affect or memory.        Current Meds  Medication Sig  . albuterol (PROAIR HFA) 108 (90 Base) MCG/ACT inhaler Inhale 1-2 puffs every 6 (six) hours as needed into the lungs for wheezing or shortness of breath.  . budesonide (PULMICORT) 0.25 MG/2ML nebulizer solution Take 0.25 mg daily by nebulization.   . Calcium Carbonate-Vitamin D (CALCIUM + D PO) Take 1 tablet by mouth daily.   . cephALEXin (KEFLEX) 500 MG capsule Take 1 capsule by mouth 3 (three) times daily.  . Cetirizine HCl (ZYRTEC ALLERGY PO) Take 1 tablet by mouth daily.   . Montelukast Sodium (SINGULAIR PO) Take 1 tablet by mouth daily.   . Multiple Vitamin (MULTIVITAMIN) capsule Take 1 capsule by mouth daily.    Marland Kitchen omega-3 acid ethyl esters (LOVAZA) 1 g capsule Take 2 capsules (2 g total) by mouth 2 (two) times daily.  . [DISCONTINUED] mometasone-formoterol (DULERA) 100-5 MCG/ACT AERO Inhale 2 puffs 2 (two) times daily into the lungs.                       Objective:   Physical Exam    amb wf nad unusual affect    04/20/2017      141  04/01/2017       140    08/07/14 139 lb (63.05 kg)  06/06/14 142 lb (64.411 kg)  02/16/14 136 lb (61.689 kg)    Vital signs reviewed  - Note on arrival 02 sats  97% on RA     HEENT: nl dentition, turbinates bilaterally, and oropharynx. Nl  external ear canals without cough reflex   NECK :  without JVD/Nodes/TM/ nl carotid upstrokes bilaterally   LUNGS: no acc muscle use,  Nl contour chest which is clear to A and P bilaterally without cough on insp or exp maneuvers   CV:  RRR  no s3 or murmur or increase in P2, and no edema   ABD:  soft and nontender with nl inspiratory excursion in the supine position. No bruits or organomegaly appreciated, bowel sounds nl  MS:  Nl gait/ ext warm without deformities, calf tenderness, cyanosis or clubbing No obvious joint restrictions   SKIN: warm and dry without lesions    NEURO:  alert, approp, nl sensorium with  no motor or cerebellar deficits apparent.                   Assessment:

## 2017-04-21 ENCOUNTER — Encounter: Payer: Self-pay | Admitting: Internal Medicine

## 2017-04-21 NOTE — Assessment & Plan Note (Addendum)
FENO 04/01/2017  =   62  -  04/01/2017      90% > try dulera 100 2bid and gerd rx > min improvement  - Allergy profile 04/01/2017 >  Eos 0.4/  IgE 36  RAST neg - 04/20/2017  After extensive coaching HFA effectiveness =    75% try symb 160 2bid   The elevation of FENO is suggestive she does have eos airways inflammation that may respond to a higher dose of ICS (Symb 160) but may have enough of an upper airway issue so advised to stop it if not improving on trial sample and  maint on max gerd rx until cough is resolved to prevent cyclical cough  I had an extended discussion with the patient reviewing all relevant studies completed to date and  lasting 15 to 20 minutes of a 25 minute visit    Each maintenance medication was reviewed in detail including most importantly the difference between maintenance and prns and under what circumstances the prns are to be triggered using an action plan format that is not reflected in the computer generated alphabetically organized AVS.    Please see AVS for specific instructions unique to this visit that I personally wrote and verbalized to the the pt in detail and then reviewed with pt  by my nurse highlighting any  changes in therapy recommended at today's visit to their plan of care.

## 2017-04-30 ENCOUNTER — Encounter: Payer: Self-pay | Admitting: Internal Medicine

## 2017-05-05 ENCOUNTER — Encounter: Payer: Self-pay | Admitting: Women's Health

## 2017-05-05 ENCOUNTER — Ambulatory Visit (INDEPENDENT_AMBULATORY_CARE_PROVIDER_SITE_OTHER): Payer: 59 | Admitting: Women's Health

## 2017-05-05 VITALS — BP 110/80 | Ht 62.0 in | Wt 140.0 lb

## 2017-05-05 DIAGNOSIS — E782 Mixed hyperlipidemia: Secondary | ICD-10-CM | POA: Diagnosis not present

## 2017-05-05 DIAGNOSIS — E559 Vitamin D deficiency, unspecified: Secondary | ICD-10-CM

## 2017-05-05 DIAGNOSIS — Z13220 Encounter for screening for lipoid disorders: Secondary | ICD-10-CM | POA: Diagnosis not present

## 2017-05-05 DIAGNOSIS — Z01419 Encounter for gynecological examination (general) (routine) without abnormal findings: Secondary | ICD-10-CM | POA: Diagnosis not present

## 2017-05-05 MED ORDER — OMEGA-3-ACID ETHYL ESTERS 1 G PO CAPS: 2.0000 g | ORAL_CAPSULE | Freq: Two times a day (BID) | ORAL | 3 refills | Status: DC

## 2017-05-05 MED ORDER — OMEGA-3-ACID ETHYL ESTERS 1 G PO CAPS: 1.0000 g | ORAL_CAPSULE | Freq: Two times a day (BID) | ORAL | 12 refills | Status: DC

## 2017-05-05 NOTE — Patient Instructions (Signed)
Health Maintenance for Postmenopausal Women Menopause is a normal process in which your reproductive ability comes to an end. This process happens gradually over a span of months to years, usually between the ages of 22 and 9. Menopause is complete when you have missed 12 consecutive menstrual periods. It is important to talk with your health care provider about some of the most common conditions that affect postmenopausal women, such as heart disease, cancer, and bone loss (osteoporosis). Adopting a healthy lifestyle and getting preventive care can help to promote your health and wellness. Those actions can also lower your chances of developing some of these common conditions. What should I know about menopause? During menopause, you may experience a number of symptoms, such as:  Moderate-to-severe hot flashes.  Night sweats.  Decrease in sex drive.  Mood swings.  Headaches.  Tiredness.  Irritability.  Memory problems.  Insomnia.  Choosing to treat or not to treat menopausal changes is an individual decision that you make with your health care provider. What should I know about hormone replacement therapy and supplements? Hormone therapy products are effective for treating symptoms that are associated with menopause, such as hot flashes and night sweats. Hormone replacement carries certain risks, especially as you become older. If you are thinking about using estrogen or estrogen with progestin treatments, discuss the benefits and risks with your health care provider. What should I know about heart disease and stroke? Heart disease, heart attack, and stroke become more likely as you age. This may be due, in part, to the hormonal changes that your body experiences during menopause. These can affect how your body processes dietary fats, triglycerides, and cholesterol. Heart attack and stroke are both medical emergencies. There are many things that you can do to help prevent heart disease  and stroke:  Have your blood pressure checked at least every 1-2 years. High blood pressure causes heart disease and increases the risk of stroke.  If you are 53-22 years old, ask your health care provider if you should take aspirin to prevent a heart attack or a stroke.  Do not use any tobacco products, including cigarettes, chewing tobacco, or electronic cigarettes. If you need help quitting, ask your health care provider.  It is important to eat a healthy diet and maintain a healthy weight. ? Be sure to include plenty of vegetables, fruits, low-fat dairy products, and lean protein. ? Avoid eating foods that are high in solid fats, added sugars, or salt (sodium).  Get regular exercise. This is one of the most important things that you can do for your health. ? Try to exercise for at least 150 minutes each week. The type of exercise that you do should increase your heart rate and make you sweat. This is known as moderate-intensity exercise. ? Try to do strengthening exercises at least twice each week. Do these in addition to the moderate-intensity exercise.  Know your numbers.Ask your health care provider to check your cholesterol and your blood glucose. Continue to have your blood tested as directed by your health care provider.  What should I know about cancer screening? There are several types of cancer. Take the following steps to reduce your risk and to catch any cancer development as early as possible. Breast Cancer  Practice breast self-awareness. ? This means understanding how your breasts normally appear and feel. ? It also means doing regular breast self-exams. Let your health care provider know about any changes, no matter how small.  If you are 40  or older, have a clinician do a breast exam (clinical breast exam or CBE) every year. Depending on your age, family history, and medical history, it may be recommended that you also have a yearly breast X-ray (mammogram).  If you  have a family history of breast cancer, talk with your health care provider about genetic screening.  If you are at high risk for breast cancer, talk with your health care provider about having an MRI and a mammogram every year.  Breast cancer (BRCA) gene test is recommended for women who have family members with BRCA-related cancers. Results of the assessment will determine the need for genetic counseling and BRCA1 and for BRCA2 testing. BRCA-related cancers include these types: ? Breast. This occurs in males or females. ? Ovarian. ? Tubal. This may also be called fallopian tube cancer. ? Cancer of the abdominal or pelvic lining (peritoneal cancer). ? Prostate. ? Pancreatic.  Cervical, Uterine, and Ovarian Cancer Your health care provider may recommend that you be screened regularly for cancer of the pelvic organs. These include your ovaries, uterus, and vagina. This screening involves a pelvic exam, which includes checking for microscopic changes to the surface of your cervix (Pap test).  For women ages 21-65, health care providers may recommend a pelvic exam and a Pap test every three years. For women ages 79-65, they may recommend the Pap test and pelvic exam, combined with testing for human papilloma virus (HPV), every five years. Some types of HPV increase your risk of cervical cancer. Testing for HPV may also be done on women of any age who have unclear Pap test results.  Other health care providers may not recommend any screening for nonpregnant women who are considered low risk for pelvic cancer and have no symptoms. Ask your health care provider if a screening pelvic exam is right for you.  If you have had past treatment for cervical cancer or a condition that could lead to cancer, you need Pap tests and screening for cancer for at least 20 years after your treatment. If Pap tests have been discontinued for you, your risk factors (such as having a new sexual partner) need to be  reassessed to determine if you should start having screenings again. Some women have medical problems that increase the chance of getting cervical cancer. In these cases, your health care provider may recommend that you have screening and Pap tests more often.  If you have a family history of uterine cancer or ovarian cancer, talk with your health care provider about genetic screening.  If you have vaginal bleeding after reaching menopause, tell your health care provider.  There are currently no reliable tests available to screen for ovarian cancer.  Lung Cancer Lung cancer screening is recommended for adults 69-62 years old who are at high risk for lung cancer because of a history of smoking. A yearly low-dose CT scan of the lungs is recommended if you:  Currently smoke.  Have a history of at least 30 pack-years of smoking and you currently smoke or have quit within the past 15 years. A pack-year is smoking an average of one pack of cigarettes per day for one year.  Yearly screening should:  Continue until it has been 15 years since you quit.  Stop if you develop a health problem that would prevent you from having lung cancer treatment.  Colorectal Cancer  This type of cancer can be detected and can often be prevented.  Routine colorectal cancer screening usually begins at  age 42 and continues through age 45.  If you have risk factors for colon cancer, your health care provider may recommend that you be screened at an earlier age.  If you have a family history of colorectal cancer, talk with your health care provider about genetic screening.  Your health care provider may also recommend using home test kits to check for hidden blood in your stool.  A small camera at the end of a tube can be used to examine your colon directly (sigmoidoscopy or colonoscopy). This is done to check for the earliest forms of colorectal cancer.  Direct examination of the colon should be repeated every  5-10 years until age 71. However, if early forms of precancerous polyps or small growths are found or if you have a family history or genetic risk for colorectal cancer, you may need to be screened more often.  Skin Cancer  Check your skin from head to toe regularly.  Monitor any moles. Be sure to tell your health care provider: ? About any new moles or changes in moles, especially if there is a change in a mole's shape or color. ? If you have a mole that is larger than the size of a pencil eraser.  If any of your family members has a history of skin cancer, especially at a Bryauna Byrum age, talk with your health care provider about genetic screening.  Always use sunscreen. Apply sunscreen liberally and repeatedly throughout the day.  Whenever you are outside, protect yourself by wearing long sleeves, pants, a wide-brimmed hat, and sunglasses.  What should I know about osteoporosis? Osteoporosis is a condition in which bone destruction happens more quickly than new bone creation. After menopause, you may be at an increased risk for osteoporosis. To help prevent osteoporosis or the bone fractures that can happen because of osteoporosis, the following is recommended:  If you are 46-71 years old, get at least 1,000 mg of calcium and at least 600 mg of vitamin D per day.  If you are older than age 55 but younger than age 65, get at least 1,200 mg of calcium and at least 600 mg of vitamin D per day.  If you are older than age 54, get at least 1,200 mg of calcium and at least 800 mg of vitamin D per day.  Smoking and excessive alcohol intake increase the risk of osteoporosis. Eat foods that are rich in calcium and vitamin D, and do weight-bearing exercises several times each week as directed by your health care provider. What should I know about how menopause affects my mental health? Depression may occur at any age, but it is more common as you become older. Common symptoms of depression  include:  Low or sad mood.  Changes in sleep patterns.  Changes in appetite or eating patterns.  Feeling an overall lack of motivation or enjoyment of activities that you previously enjoyed.  Frequent crying spells.  Talk with your health care provider if you think that you are experiencing depression. What should I know about immunizations? It is important that you get and maintain your immunizations. These include:  Tetanus, diphtheria, and pertussis (Tdap) booster vaccine.  Influenza every year before the flu season begins.  Pneumonia vaccine.  Shingles vaccine.  Your health care provider may also recommend other immunizations. This information is not intended to replace advice given to you by your health care provider. Make sure you discuss any questions you have with your health care provider. Document Released: 07/03/2005  Document Revised: 11/29/2015 Document Reviewed: 02/12/2015 Elsevier Interactive Patient Education  2018 Elsevier Inc.  

## 2017-05-05 NOTE — Addendum Note (Signed)
Addended by: Lorine Bears on: 05/05/2017 01:03 PM   Modules accepted: Orders

## 2017-05-05 NOTE — Progress Notes (Signed)
Tara Carpenter 25-Jul-1960 [id number]    History:    Presents for annual exam. Postmenopausal on no HRT with no bleeding. Normal Pap and mammogram history. Mother deceased from colon cancer 2 negative screening colonoscopies last one 2017 with Dr. Collene Mares.. 2017 T score -2.2 at right hip, -1.7 left hip, FRAX 12%/2.1%. Has had elevated triglycerides/cholesterol in the past did not tolerate statins, tolerating Lovaza.  Past medical history, past surgical history, family history and social history were all reviewed and documented in the EPIC chart. History of GDM. 3 children all doing well. Working at a travel agency part time. 2017 nasal polyp removal.  ROS:  A ROS was performed and pertinent positives and negatives are included.  Exam:  Vitals:   05/05/17 1015  BP: 110/80  Weight: 140 lb (63.5 kg)  Height: 5\' 2"  (1.575 m)   Body mass index is 25.61 kg/m.   General appearance:  Normal Thyroid:  Symmetrical, normal in size, without palpable masses or nodularity. Respiratory  Auscultation:  Clear without wheezing or rhonchi Cardiovascular  Auscultation:  Regular rate, without rubs, murmurs or gallops  Edema/varicosities:  Not grossly evident Abdominal  Soft,nontender, without masses, guarding or rebound.  Liver/spleen:  No organomegaly noted  Hernia:  None appreciated  Skin  Inspection:  Grossly normal   Breasts: Examined lying and sitting.     Right: Without masses, retractions, discharge or axillary adenopathy.     Left: Without masses, retractions, discharge or axillary adenopathy. Gentitourinary   Inguinal/mons:  Normal without inguinal adenopathy  External genitalia:  Normal  BUS/Urethra/Skene's glands:  Normal  Vagina:  Normal  Cervix:  Normal  Uterus:  normal in size, shape and contour.  Midline and mobile  Adnexa/parametria:     Rt: Without masses or tenderness.   Lt: Without masses or tenderness.  Anus and perineum: Normal  Digital rectal exam: Normal sphincter tone  without palpated masses or tenderness  Assessment/Plan:  56 y.o. M WF G4 P3 for annual exam with no complaints.  Postmenopausal/no HRT/no bleeding Osteopenia without elevated FRAX 2017 nasal polyps/seasonal allergies/ sinusitis Elevated triglycerides  Plan: SBE's, continue annual screening mammogram, calcium rich diet, vitamin D 2000 daily encouraged. Reviewed importance of continuing and maintaining regular exercise routine, yoga, balance type exercise encouraged. Home safety, fall prevention discussed. Lovaza 1 g twice daily prescription, proper use given and reviewed, will continue low treated fat diet. CBC, CMP, lipid panel, vitamin D, Pap with HR HPV typing, new screening guidelines reviewed.    Winger, 11:12 AM 05/05/2017

## 2017-05-06 LAB — CBC WITH DIFFERENTIAL/PLATELET
BASOS ABS: 38 {cells}/uL (ref 0–200)
Basophils Relative: 0.8 %
EOS ABS: 466 {cells}/uL (ref 15–500)
EOS PCT: 9.7 %
HEMATOCRIT: 41.2 % (ref 35.0–45.0)
HEMOGLOBIN: 14.1 g/dL (ref 11.7–15.5)
LYMPHS ABS: 1402 {cells}/uL (ref 850–3900)
MCH: 31.9 pg (ref 27.0–33.0)
MCHC: 34.2 g/dL (ref 32.0–36.0)
MCV: 93.2 fL (ref 80.0–100.0)
MPV: 10.2 fL (ref 7.5–12.5)
Monocytes Relative: 6.2 %
NEUTROS ABS: 2597 {cells}/uL (ref 1500–7800)
Neutrophils Relative %: 54.1 %
Platelets: 282 10*3/uL (ref 140–400)
RBC: 4.42 10*6/uL (ref 3.80–5.10)
RDW: 11.8 % (ref 11.0–15.0)
Total Lymphocyte: 29.2 %
WBC: 4.8 10*3/uL (ref 3.8–10.8)
WBCMIX: 298 {cells}/uL (ref 200–950)

## 2017-05-06 LAB — COMPREHENSIVE METABOLIC PANEL
AG RATIO: 1.8 (calc) (ref 1.0–2.5)
ALT: 11 U/L (ref 6–29)
AST: 15 U/L (ref 10–35)
Albumin: 4.4 g/dL (ref 3.6–5.1)
Alkaline phosphatase (APISO): 62 U/L (ref 33–130)
BILIRUBIN TOTAL: 0.5 mg/dL (ref 0.2–1.2)
BUN: 14 mg/dL (ref 7–25)
CALCIUM: 9.8 mg/dL (ref 8.6–10.4)
CO2: 26 mmol/L (ref 20–32)
Chloride: 103 mmol/L (ref 98–110)
Creat: 0.82 mg/dL (ref 0.50–1.05)
Globulin: 2.5 g/dL (calc) (ref 1.9–3.7)
Glucose, Bld: 99 mg/dL (ref 65–99)
Potassium: 4.2 mmol/L (ref 3.5–5.3)
SODIUM: 139 mmol/L (ref 135–146)
TOTAL PROTEIN: 6.9 g/dL (ref 6.1–8.1)

## 2017-05-06 LAB — PAP IG W/ RFLX HPV ASCU

## 2017-05-06 LAB — VITAMIN D 25 HYDROXY (VIT D DEFICIENCY, FRACTURES): Vit D, 25-Hydroxy: 37 ng/mL (ref 30–100)

## 2017-05-06 LAB — LIPID PANEL
CHOL/HDL RATIO: 3.8 (calc) (ref <5.0)
Cholesterol: 294 mg/dL — ABNORMAL HIGH (ref <200)
HDL: 77 mg/dL (ref >50)
LDL CHOLESTEROL (CALC): 191 mg/dL — AB
NON-HDL CHOLESTEROL (CALC): 217 mg/dL — AB (ref <130)
TRIGLYCERIDES: 123 mg/dL (ref <150)

## 2017-05-07 LAB — CULTURE INDICATED

## 2017-05-07 LAB — URINE CULTURE
MICRO NUMBER: 81401836
SPECIMEN QUALITY:: ADEQUATE

## 2017-05-07 LAB — URINALYSIS W MICROSCOPIC + REFLEX CULTURE
Bacteria, UA: NONE SEEN /HPF
Bilirubin Urine: NEGATIVE
Glucose, UA: NEGATIVE
Hgb urine dipstick: NEGATIVE
Hyaline Cast: NONE SEEN /LPF
Ketones, ur: NEGATIVE
Nitrites, Initial: NEGATIVE
Protein, ur: NEGATIVE
RBC / HPF: NONE SEEN /HPF (ref 0–2)
Specific Gravity, Urine: 1.005 (ref 1.001–1.03)
Squamous Epithelial / HPF: NONE SEEN /HPF (ref <=5)
WBC, UA: NONE SEEN /HPF (ref 0–5)
pH: 6 (ref 5.0–8.0)

## 2017-06-02 ENCOUNTER — Ambulatory Visit (INDEPENDENT_AMBULATORY_CARE_PROVIDER_SITE_OTHER): Payer: 59 | Admitting: Internal Medicine

## 2017-06-02 ENCOUNTER — Ambulatory Visit (INDEPENDENT_AMBULATORY_CARE_PROVIDER_SITE_OTHER)
Admission: RE | Admit: 2017-06-02 | Discharge: 2017-06-02 | Disposition: A | Discharge status: Home / Self Care | Payer: 59 | Source: Ambulatory Visit | Attending: Internal Medicine | Admitting: Internal Medicine

## 2017-06-02 ENCOUNTER — Encounter: Payer: Self-pay | Admitting: Internal Medicine

## 2017-06-02 VITALS — BP 126/78 | HR 71 | Ht 62.0 in | Wt 139.8 lb

## 2017-06-02 DIAGNOSIS — J45991 Cough variant asthma: Secondary | ICD-10-CM

## 2017-06-02 DIAGNOSIS — R05 Cough: Secondary | ICD-10-CM | POA: Diagnosis not present

## 2017-06-02 LAB — NITRIC OXIDE: Nitric Oxide: 85

## 2017-06-02 MED ORDER — PREDNISONE 10 MG PO TABS: ORAL_TABLET | ORAL | 0 refills | Status: DC

## 2017-06-02 MED ORDER — BUDESONIDE-FORMOTEROL FUMARATE 160-4.5 MCG/ACT IN AERO: 2.0000 | INHALATION_SPRAY | Freq: Two times a day (BID) | RESPIRATORY_TRACT | 0 refills | Status: DC

## 2017-06-02 NOTE — Patient Instructions (Addendum)
Prednisone 10 mg take  4 each am x 2 days,   2 each am x 2 days,  1 each am x 2 days and stop   symbicort 160 Take 2 puffs first thing in am and then another 2 puffs about 12 hours later.     Only use your albuterol as a rescue medication to be used if you can't catch your breath by resting or doing a relaxed purse lip breathing pattern.  - The less you use it, the better it will work when you need it. - Ok to use up to 2 puffs  every 4 hours if you must but call for immediate appointment if use goes up over your usual need - Don't leave home without it !!  (think of it like the spare tire for your car)    Stay on the high dose acid suppression until the cough is 100% gone  For at least a week and leave off the fish oil for now    Please remember to go to the  x-ray department downstairs in the basement  for your tests - we will call you with the results when they are available.       Please schedule a follow up office visit in 4 weeks, sooner if needed with all inhalers in hand

## 2017-06-02 NOTE — Progress Notes (Signed)
Subjective:     Patient ID: Tara Carpenter, female   DOB: [birth year],    MRN: [id number]    Brief patient profile:  85 yowf never smoker onset of asthma 2nd pregancy 1st trimester manifested by cough better with prednisone / inhalers allergy w/u by Donneta Romberg pos trees/grasses  And ? Better p  Shots not clear but able to stop all meds with no flare though  some sinus issues = nasal polyposis  >>4  surgeries [facility]man/ lee/matthews last 2017  maintained on  singulair and rare need for albuterol but since the  referred by Dr Linna Darner to pulmonary clinic 08/07/2014 for recurrent cough     History of Present Illness  08/07/2014 1st [facility] office visit/ Wert   Chief Complaint  Patient presents with  . Pulmonary Consult    Referred by Dr. Unice Cobble. Pt states that she was dxed with asthma during her second pregnancy in 1995. She c/o wheezing and "hard to take in a deep breath" since Jan 2016.    in Dec 2015 started noticing need for albuterol several times per week helped a lot but symbicort did not work well  Then prednisone improved by 75% still not able to take full breath, occ cough  Dry day > noct mostly dry  Assoc with more sinus problems than usual  Back to exercising but tolerance not back to nl for her  Ok at hs  rec GERD diet  Work on Product manager inhaler technique:    If getting worse > add Try prilosec 20mg   Take 30-60 min before first meal of the day and Pepcid 20 mg one bedtime   Please remember to go to the  x-ray department downstairs for your tests - we will call you with the results when they are available. If not better see me  after a month, if 100% better ok to taper symbicort after a month    04/01/2017  Acute ext f/u ov/Wert re: re establish re recurrent cough  Chief Complaint  Patient presents with  . Acute Visit    Pt c/o SOB, PND, and cough since end of Sept 2018. She states feels like there is something in her throat. She had some pred on hand and tried taking  this w/o much relief.  She is using her proair inhaler 2-3 x per day on average.   acute onset late Sept 2018 recurrent cough while maint on singulair   worse noct sev hours p hs and always  dry assoc with excessive throat cleairng  Prednisone may be 25% / symbicort did not help this time   No assoc nasal symptoms  Laurence Ferrari helps a little but Not limited by breathing from desired activities   rec Try prilosec otc 20mg   Take 30-60 min before first meal of the day and Pepcid ac (famotidine) 20 mg one @  bedtime until cough is completely gone for at least a week without the need for cough suppression GERD diet  Dulera 100 Take 2 puffs first thing in am and then another 2 puffs about 12 hours later - ok to take as needed after you try it out for a full 2 weeks Please remember to go to the lab department downstairs in the basement  for your tests - we will call you with the results when they are available. Follow up if not 100% satisfied     04/20/2017  f/u ov/Wert re: cough since mid sept 2018  Chief Complaint  Patient presents  with  . Follow-up    Cough has only improved slightly.  She is not producing any sputum. She was seen by ENT at [facility] and was dxed with sinus infection-currently on cephalexin.   took dulera x 2 weeks and d/c 04/17/17 ? maybe a little better  Day 6 abx per ent Still on pepcid 20 mg no longer on prilosec  No change  off vs back on fish oil  Totally sporadic harsh cough sev times a day / superimposed on throat clearing Cough day > noct  Laughing or cold air exp make her cough  rec Stay on the high dose acid suppression until the cough is 100% gone  GERD diet  Try symbiocort 160 Take 2 puffs first thing in am and then another 2 puffs about 12 hours later to see what difference it makes and if not convinced it's helping please stop  - repeat feno next ov if still coughing      06/02/2017  f/u ov/Wert re: recurrent cough with this flare since Sept 2018 better with higher  dose symb  Chief Complaint  Patient presents with  . Follow-up    Pt states her cough continues to improve, but has not completely resolved. She uses her albuterol inhaler 3-4 x per wk on average.    still having noct spells once a week - wakes from sleep Using less albuterol now/ stopped gerd rx even though was not  100% as per instructions and Not using symb consistently - has not used 15 d sample up yet  Not limited by breathing from desired activities   Cold air most consistent trigger for cough    No obvious day to day or daytime variability or assoc excess/ purulent sputum or mucus plugs or hemoptysis or cp or chest tightness, subjective wheeze or overt sinus or hb symptoms. No unusual exposure hx or h/o childhood pna/ asthma or knowledge of premature birth.  Sleeping ok flat without nocturnal  or early am exacerbation  of respiratory  c/o's or need for noct saba. Also denies any obvious fluctuation of symptoms with weather or environmental changes or other aggravating or alleviating factors except as outlined above   Current Allergies, Complete Past Medical History, Past Surgical History, Family History, and Social History were reviewed in [employer] record.  ROS  The following are not active complaints unless bolded Hoarseness, sore throat, dysphagia, dental problems, itching, sneezing,  nasal congestion or discharge of excess mucus or purulent secretions, ear ache,   fever, chills, sweats, unintended wt loss or wt gain, classically pleuritic or exertional cp,  orthopnea pnd or leg swelling, presyncope, palpitations, abdominal pain, anorexia, nausea, vomiting, diarrhea  or change in bowel habits or change in bladder habits, change in stools or change in urine, dysuria, hematuria,  rash, arthralgias, visual complaints, headache, numbness, weakness or ataxia or problems with walking or coordination,  change in mood/affect or memory.        Current Meds  Medication  Sig  . albuterol (PROAIR HFA) 108 (90 Base) MCG/ACT inhaler Inhale 1-2 puffs every 6 (six) hours as needed into the lungs for wheezing or shortness of breath.  . budesonide (PULMICORT) 0.25 MG/2ML nebulizer solution Take 0.25 mg by nebulization daily. Nasal rinse  . Calcium Carbonate-Vitamin D (CALCIUM + D PO) Take 1 tablet by mouth daily.   . Cetirizine HCl (ZYRTEC ALLERGY PO) Take 1 tablet by mouth daily.   . Montelukast Sodium (SINGULAIR PO) Take 1 tablet by mouth  daily.   . Multiple Vitamin (MULTIVITAMIN) capsule Take 1 capsule by mouth daily.    . [DISCONTINUED] omega-3 acid ethyl esters (LOVAZA) 1 g capsule Take 1 capsule (1 g total) by mouth 2 (two) times daily.                            Objective:   Physical Exam   amb wf nad   06/02/2017          140  04/20/2017      141  04/01/2017       140    08/07/14 139 lb (63.05 kg)  06/06/14 142 lb (64.411 kg)  02/16/14 136 lb (61.689 kg)    Vital signs reviewed - Note on arrival 02 sats  98% on RA      HEENT: nl dentition, turbinates bilaterally, and oropharynx. Nl external ear canals without cough reflex   NECK :  without JVD/Nodes/TM/ nl carotid upstrokes bilaterally   LUNGS: no acc muscle use,  Nl contour chest which is clear to A and P bilaterally without cough on insp or exp maneuvers   CV:  RRR  no s3 or murmur or increase in P2, and no edema   ABD:  soft and nontender with nl inspiratory excursion in the supine position. No bruits or organomegaly appreciated, bowel sounds nl  MS:  Nl gait/ ext warm without deformities, calf tenderness, cyanosis or clubbing No obvious joint restrictions   SKIN: warm and dry without lesions    NEURO:  alert, approp, nl sensorium with  no motor or cerebellar deficits apparent.                CXR PA and Lateral:   06/02/2017 :    I personally reviewed images and  Impression is as follows:   wnl        Assessment:

## 2017-06-03 ENCOUNTER — Encounter: Payer: Self-pay | Admitting: Internal Medicine

## 2017-06-03 NOTE — Assessment & Plan Note (Addendum)
FENO 04/01/2017  =   62  -  04/01/2017      90% > try dulera 100 2bid and gerd rx > min improvement  - Allergy profile 04/01/2017 >  Eos 0.4/  IgE 36  RAST neg - 04/20/2017   try symb 160 2bid > did not take consistently  - 06/02/2017  After extensive coaching inhaler device  effectiveness =  90 %   FENO 06/02/17 = 85 > rec take symb 160 2bid consistently = one more sample/ then fill rx and return in 6 weeks with all inhalers   Hx and feno strongly support cough variant asthma with the only issue = adherence  Try pred x 6 days and again advised re max rx for gerd until cough 100% resolved x one week        Each maintenance medication was reviewed in detail including most importantly the difference between maintenance and prns and under what circumstances the prns are to be triggered using an action plan format that is not reflected in the computer generated alphabetically organized AVS.    Please see AVS for specific instructions unique to this visit that I personally wrote and verbalized to the the pt in detail and then reviewed with pt  by my nurse highlighting any  changes in therapy recommended at today's visit to their plan of care.

## 2017-06-03 NOTE — Progress Notes (Signed)
Spoke with pt and notified of results per Dr. Wert. Pt verbalized understanding and denied any questions. 

## 2017-06-30 ENCOUNTER — Encounter: Payer: Self-pay | Admitting: Internal Medicine

## 2017-06-30 ENCOUNTER — Ambulatory Visit (INDEPENDENT_AMBULATORY_CARE_PROVIDER_SITE_OTHER): Payer: 59 | Admitting: Internal Medicine

## 2017-06-30 VITALS — BP 124/70 | HR 85 | Ht 62.0 in | Wt 141.0 lb

## 2017-06-30 DIAGNOSIS — J45991 Cough variant asthma: Secondary | ICD-10-CM | POA: Diagnosis not present

## 2017-06-30 MED ORDER — BUDESONIDE-FORMOTEROL FUMARATE 160-4.5 MCG/ACT IN AERO: 2.0000 | INHALATION_SPRAY | Freq: Two times a day (BID) | RESPIRATORY_TRACT | 11 refills | Status: DC

## 2017-06-30 NOTE — Patient Instructions (Addendum)
Symbicort 160 Take 2 puffs first thing in am and then another 2 puffs about 12 hours later.   Only use your albuterol as a rescue medication to be used if you can't catch your breath by resting or doing a relaxed purse lip breathing pattern.  - The less you use it, the better it will work when you need it. - Ok to use up to 2 puffs  every 4 hours if you must but call for immediate appointment if use goes up over your usual need - Don't leave home without it !!  (think of it like the spare tire for your car)    If coughing for any reason >  Try prilosec otc 20mg   Take 30-60 min before first meal of the day and Pepcid ac (famotidine) 20 mg one @  bedtime until cough is completely gone for at least a week without the need for cough suppression    If you are satisfied with your treatment plan,  let your doctor know and he/she can either refill your medications or you can return here when your prescription runs out.     If in any way you are not 100% satisfied,  please tell us.  If 100% better, tell your friends!  Pulmonary follow up is as needed

## 2017-06-30 NOTE — Progress Notes (Signed)
Subjective:     Patient ID: Tara Carpenter, female   DOB: [birth year],    MRN: [id number]    Brief patient profile:  34 yowf never smoker onset of asthma 2nd pregancy 1st trimester manifested by cough better with prednisone / inhalers allergy w/u by Donneta Romberg pos trees/grasses  And ? Better p  Shots not clear but able to stop all meds with no flare though  some sinus issues = nasal polyposis  >>4  surgeries [facility]/ [facility] last 2017  maintained on  singulair and rare need for albuterol but since Dec 2015 worse cough so referred by Dr Linna Darner to pulmonary clinic 08/07/2014      History of Present Illness  08/07/2014 1st  Pulmonary office visit/ Tara Carpenter   Chief Complaint  Patient presents with  . Pulmonary Consult    Referred by Dr. Unice Cobble. Pt states that she was dxed with asthma during her second pregnancy in 1995. She c/o wheezing and "hard to take in a deep breath" since Jan 2016.    in Dec 2015 started noticing need for albuterol several times per week helped a lot but symbicort did not work well  Then prednisone improved by 75% still not able to take full breath, occ cough  Dry day > noct mostly dry  Assoc with more sinus problems than usual  Back to exercising but tolerance not back to nl for her  Ok at hs  rec GERD diet  Work on Product manager inhaler technique:    If getting worse > add Try prilosec 20mg   Take 30-60 min before first meal of the day and Pepcid 20 mg one bedtime   Please remember to go to the  x-ray department downstairs for your tests - we will call you with the results when they are available. If not better see me  after a month, if 100% better ok to taper symbicort after a month      04/01/2017  Acute ext f/u ov/Tara Carpenter re: re establish re recurrent cough  Chief Complaint  Patient presents with  . Acute Visit    Pt c/o SOB, PND, and cough since end of Sept 2018. She states feels like there is something in her throat. She had some pred on hand and tried taking  this w/o much relief.  She is using her proair inhaler 2-3 x per day on average.   acute onset late Sept 2018 recurrent cough while maint on singulair   worse noct sev hours p hs and always  dry assoc with excessive throat cleairng  Prednisone may be 25% / symbicort did not help this time   No assoc nasal symptoms  Laurence Ferrari helps a little but Not limited by breathing from desired activities   rec Try prilosec otc 20mg   Take 30-60 min before first meal of the day and Pepcid ac (famotidine) 20 mg one @  bedtime until cough is completely gone for at least a week without the need for cough suppression GERD diet  Dulera 100 Take 2 puffs first thing in am and then another 2 puffs about 12 hours later - ok to take as needed after you try it out for a full 2 weeks Please remember to go to the lab department downstairs in the basement  for your tests - we will call you with the results when they are available. Follow up if not 100% satisfied     04/20/2017  f/u ov/Tara Carpenter re: cough since mid sept 2018  Chief Complaint  Patient presents with  . Follow-up    Cough has only improved slightly.  She is not producing any sputum. She was seen by ENT at [facility] and was dxed with sinus infection-currently on cephalexin.   took dulera x 2 weeks and d/c 04/17/17 ? maybe a little better  Day 6 abx per ent Still on pepcid 20 mg no longer on prilosec  No change  off vs back on fish oil  Totally sporadic harsh cough sev times a day / superimposed on throat clearing Cough day > noct  Laughing or cold air exp make her cough  rec Stay on the high dose acid suppression until the cough is 100% gone  GERD diet  Try symbiocort 160 Take 2 puffs first thing in am and then another 2 puffs about 12 hours later to see what difference it makes and if not convinced it's helping please stop  - repeat feno next ov if still coughing      06/02/2017  f/u ov/Tara Carpenter re: recurrent cough with this flare since Sept 2018 better with higher  dose symb  Chief Complaint  Patient presents with  . Follow-up    Pt states her cough continues to improve, but has not completely resolved. She uses her albuterol inhaler 3-4 x per wk on average.    still having noct spells once a week - wakes from sleep Using less albuterol now/ stopped gerd rx even though was not  100% as per instructions and Not using symb consistently - has not used 15 d sample up yet  Not limited by breathing from desired activities   Cold air most consistent trigger for cough  rec Prednisone 10 mg take  4 each am x 2 days,   2 each am x 2 days,  1 each am x 2 days and stop  Symbicort 160 Take 2 puffs first thing in am and then another 2 puffs about 12 hours later.  Only use your albuterol as a rescue medication to be used if you can't catch your breath  Stay on the high dose acid suppression until the cough is 100% gone  For at least a week and leave off the fish oil for now      06/30/2017  f/u ov/Tara Carpenter re: cough since sept 2018 better to her satisfaction  Chief Complaint  Patient presents with  . Follow-up    Barely coughing and her breathing has improved. She has not had to user her albuterol.   Dyspnea:  No problem with exercise  Cough: minimum Sleep: fine now   No obvious day to day or daytime variability or assoc excess/ purulent sputum or mucus plugs or hemoptysis or cp or chest tightness, subjective wheeze or overt sinus or hb symptoms. No unusual exposure hx or h/o childhood pna/ asthma or knowledge of premature birth.  Sleeping ok flat without nocturnal  or early am exacerbation  of respiratory  c/o's or need for noct saba. Also denies any obvious fluctuation of symptoms with weather or environmental changes or other aggravating or alleviating factors except as outlined above   Current Allergies, Complete Past Medical History, Past Surgical History, Family History, and Social History were reviewed in [employer] record.      Current Meds  Medication Sig  . albuterol (PROAIR HFA) 108 (90 Base) MCG/ACT inhaler Inhale 1-2 puffs every 6 (six) hours as needed into the lungs for wheezing or shortness of breath.  . budesonide (PULMICORT) 0.25 MG/2ML nebulizer solution  Take 0.25 mg by nebulization daily. Nasal rinse  . budesonide-formoterol (SYMBICORT) 160-4.5 MCG/ACT inhaler Inhale 2 puffs into the lungs 2 (two) times daily.  . Calcium Carbonate-Vitamin D (CALCIUM + D PO) Take 1 tablet by mouth daily.   . Cetirizine HCl (ZYRTEC ALLERGY PO) Take 1 tablet by mouth daily.   . Montelukast Sodium (SINGULAIR PO) Take 1 tablet by mouth daily.   . Multiple Vitamin (MULTIVITAMIN) capsule Take 1 capsule by mouth daily.    .   I              Objective:   Physical Exam   amb wf nad/ all smiles   06/30/2017          141 06/02/2017          140  04/20/2017      141  04/01/2017       140    08/07/14 139 lb (63.05 kg)  06/06/14 142 lb (64.411 kg)  02/16/14 136 lb (61.689 kg)     Vital signs reviewed - Note on arrival 02 sats  97% on RA   HEENT: nl dentition, turbinates bilaterally, and oropharynx. Nl external ear canals without cough reflex   NECK :  without JVD/Nodes/TM/ nl carotid upstrokes bilaterally   LUNGS: no acc muscle use,  Nl contour chest which is clear to A and P bilaterally without cough on insp or exp maneuvers   CV:  RRR  no s3 or murmur or increase in P2, and no edema   ABD:  soft and nontender with nl inspiratory excursion in the supine position. No bruits or organomegaly appreciated, bowel sounds nl  MS:  Nl gait/ ext warm without deformities, calf tenderness, cyanosis or clubbing No obvious joint restrictions   SKIN: warm and dry without lesions    NEURO:  alert, approp, nl sensorium with  no motor or cerebellar deficits apparent.                Assessment:

## 2017-07-01 ENCOUNTER — Encounter: Payer: Self-pay | Admitting: Internal Medicine

## 2017-07-01 NOTE — Assessment & Plan Note (Signed)
FENO 04/01/2017  =   62  -  04/01/2017      90% > try dulera 100 2bid and gerd rx > min improvement  - Allergy profile 04/01/2017 >  Eos 0.4/  IgE 36  RAST neg - 04/20/2017   try symb 160 2bid > did not take consistently  - 06/02/2017  After extensive coaching inhaler device  effectiveness =  90 %   FENO 06/02/17 = 85 > rec take symb 160 2bid consistently = one more sample/ then fill rx and return in 6 weeks with all inhalers   All goals of chronic asthma control met including optimal function and elimination of symptoms with minimal need for rescue therapy.  She tends to slack off symbicort when better but based on hx should continue to use up to 2 pff q 12 h and immediately add back gerd rx in event of cough for any reason. Reviewed   Contingencies discussed in full including contacting this office immediately if not controlling the symptoms using the rule of two's.       Pulmonary f/u is prn  

## 2017-07-20 DIAGNOSIS — Z1231 Encounter for screening mammogram for malignant neoplasm of breast: Secondary | ICD-10-CM | POA: Diagnosis not present

## 2017-08-22 ENCOUNTER — Encounter: Payer: Self-pay | Admitting: Internal Medicine

## 2017-08-23 MED ORDER — ALBUTEROL SULFATE HFA 108 (90 BASE) MCG/ACT IN AERS: 1.0000 | INHALATION_SPRAY | Freq: Four times a day (QID) | RESPIRATORY_TRACT | 5 refills | Status: DC | PRN

## 2017-09-29 DIAGNOSIS — Z79899 Other long term (current) drug therapy: Secondary | ICD-10-CM | POA: Diagnosis not present

## 2017-09-29 DIAGNOSIS — J324 Chronic pansinusitis: Secondary | ICD-10-CM | POA: Diagnosis not present

## 2017-09-29 DIAGNOSIS — J339 Nasal polyp, unspecified: Secondary | ICD-10-CM | POA: Diagnosis not present

## 2018-01-05 DIAGNOSIS — J324 Chronic pansinusitis: Secondary | ICD-10-CM | POA: Diagnosis not present

## 2018-01-05 DIAGNOSIS — J4530 Mild persistent asthma, uncomplicated: Secondary | ICD-10-CM | POA: Diagnosis not present

## 2018-01-05 DIAGNOSIS — J339 Nasal polyp, unspecified: Secondary | ICD-10-CM | POA: Diagnosis not present

## 2018-02-03 ENCOUNTER — Encounter: Payer: Self-pay | Admitting: Internal Medicine

## 2018-02-03 MED ORDER — CIPROFLOXACIN HCL 500 MG PO TABS: 500.0000 mg | ORAL_TABLET | Freq: Two times a day (BID) | ORAL | 0 refills | Status: DC

## 2018-02-27 ENCOUNTER — Encounter: Payer: Self-pay | Admitting: Internal Medicine

## 2018-04-13 NOTE — Progress Notes (Signed)
Subjective:    Patient ID: Tara Carpenter, female    DOB: [birth year], 57 y.o.   MRN: [id number]  HPI She is here for an acute visit.  For the past month she has felt a tightness in her upper neck - feels inside - typically after eating.  It can last all day.  She has noticed it mostly after eating salmon.  She has to clear her throat a lot.  She denies any gerd or reflux.  She was taking zantac regularly, but stopped taking it and her GERD has been ok - she takes pepcid every 2 weeks when she has symptoms.    Her asthma has been flared up recently.  She has a chronic dry cough from her asthma.  She has intermittent wheezing and shortness of breath.  She typically uses her Symbicort 1-2 times a day, but states she does not always use it as much as she should.  She has been using the albuterol inhaler more than usual.  Besides always clearing her throat she does have postnasal drip, difficulty swallowing at times and hoarseness at times.  She denies any fevers, chills, nasal congestion, sinus pain or ear pain.  She has not felt any swollen nodes in her neck.  Medications and allergies reviewed with patient and updated if appropriate.  Patient Active Problem List   Diagnosis Date Noted  . Thyroid nodule 04/14/2018  . Nasal polyps 06/24/2016  . Arthralgia 06/24/2016  . Easy bruising 01/05/2015  . Dyspnea 08/07/2014  . Family hx of colon cancer 04/18/2012  . Osteopenia 02/10/2012  . Hyperlipidemia 05/29/2011  . Family history of ischemic heart disease 03/24/2011  . Sinusitis, chronic 12/01/2007  . Cough variant asthma 12/01/2007  . DIABETES MELLITUS, GESTATIONAL, HX OF 12/01/2007    Current Outpatient Medications on File Prior to Visit  Medication Sig Dispense Refill  . albuterol (PROAIR HFA) 108 (90 Base) MCG/ACT inhaler Inhale 1-2 puffs into the lungs every 6 (six) hours as needed for wheezing or shortness of breath. 1 Inhaler 5  . budesonide (PULMICORT) 0.25 MG/2ML nebulizer  solution Take 0.25 mg by nebulization daily. Nasal rinse    . budesonide-formoterol (SYMBICORT) 160-4.5 MCG/ACT inhaler Inhale 2 puffs into the lungs 2 (two) times daily. 1 Inhaler 11  . Calcium Carbonate-Vitamin D (CALCIUM + D PO) Take 1 tablet by mouth daily.     . Cetirizine HCl (ZYRTEC ALLERGY PO) Take 1 tablet by mouth daily.     . Montelukast Sodium (SINGULAIR PO) Take 1 tablet by mouth daily.     . Multiple Vitamin (MULTIVITAMIN) capsule Take 1 capsule by mouth daily.       No current facility-administered medications on file prior to visit.     Past Medical History:  Diagnosis Date  . Colon polyps 02/20/2010   Tubular adenoma, Hyperplastic  . Rosacea     Past Surgical History:  Procedure Laterality Date  . NASAL SINUS SURGERY     x 3  . TEMPOROMANDIBULAR JOINT SURGERY      Social History   Socioeconomic History  . Marital status: Married    Spouse name: Not on file  . Number of children: Not on file  . Years of education: Not on file  . Highest education level: Not on file  Occupational History  . Not on file  Social Needs  . Financial resource strain: Not on file  . Food insecurity:    Worry: Not on file    Inability: Not  on file  . Transportation needs:    Medical: Not on file    Non-medical: Not on file  Tobacco Use  . Smoking status: Never Smoker  . Smokeless tobacco: Never Used  Substance and Sexual Activity  . Alcohol use: Yes    Alcohol/week: 6.0 standard drinks    Types: 6 Standard drinks or equivalent per week  . Drug use: No  . Sexual activity: Yes    Birth control/protection: Post-menopausal    Comment: Vasectomy-1st intercourse 57 yo-Fewer than 5 partners  Lifestyle  . Physical activity:    Days per week: Not on file    Minutes per session: Not on file  . Stress: Not on file  Relationships  . Social connections:    Talks on phone: Not on file    Gets together: Not on file    Attends religious service: Not on file    Active member of  club or organization: Not on file    Attends meetings of clubs or organizations: Not on file    Relationship status: Not on file  Other Topics Concern  . Not on file  Social History Narrative   Exercising regularly    Family History  Problem Relation Age of Onset  . Diabetes Mother   . Colon cancer Mother   . Diabetes Father   . Heart disease Father   . Hyperlipidemia Sister   . Hyperlipidemia Brother   . Hyperlipidemia Brother   . Hyperlipidemia Brother   . Hyperlipidemia Sister   . Hyperlipidemia Sister     Review of Systems  Constitutional: Negative for chills and fever.  HENT: Positive for postnasal drip (does sinus rinse daily), trouble swallowing (sometimes) and voice change (from cough or throat clearing). Negative for congestion, ear pain, sinus pressure, sinus pain and sore throat.   Respiratory: Positive for cough (dry - asthma related), shortness of breath and wheezing. Negative for chest tightness.   Neurological: Negative for light-headedness and headaches.       Objective:   Vitals:   04/14/18 1055  BP: 130/82  Pulse: 76  Resp: 16  Temp: 98.5 F (36.9 C)  SpO2: 96%   Filed Weights   04/14/18 1055  Weight: 136 lb 6.4 oz (61.9 kg)   Body mass index is 24.95 kg/m.  Wt Readings from Last 3 Encounters:  04/14/18 136 lb 6.4 oz (61.9 kg)  06/30/17 141 lb (64 kg)  06/02/17 139 lb 12.8 oz (63.4 kg)     Physical Exam GENERAL APPEARANCE: Appears stated age, well appearing, NAD EYES: conjunctiva clear, no icterus HEENT: bilateral tympanic membranes and ear canals normal, oropharynx with no erythema, no thyromegaly, trachea midline, no cervical, mandibular or supraclavicular lymphadenopathy LUNGS: Clear to auscultation without wheeze or crackles, unlabored breathing, good air entry bilaterally CARDIOVASCULAR: Normal S1,S2 without murmurs, no edema SKIN: warm, dry        Assessment & Plan:   See Problem List for Assessment and Plan of chronic  medical problems.

## 2018-04-14 ENCOUNTER — Encounter: Payer: Self-pay | Admitting: Internal Medicine

## 2018-04-14 ENCOUNTER — Ambulatory Visit (INDEPENDENT_AMBULATORY_CARE_PROVIDER_SITE_OTHER): Payer: 59 | Admitting: Internal Medicine

## 2018-04-14 VITALS — BP 130/82 | HR 76 | Temp 98.5°F | Resp 16 | Ht 62.0 in | Wt 136.4 lb

## 2018-04-14 DIAGNOSIS — E041 Nontoxic single thyroid nodule: Secondary | ICD-10-CM | POA: Diagnosis not present

## 2018-04-14 DIAGNOSIS — K219 Gastro-esophageal reflux disease without esophagitis: Secondary | ICD-10-CM

## 2018-04-14 MED ORDER — OMEPRAZOLE 20 MG PO CPDR: 20.0000 mg | DELAYED_RELEASE_CAPSULE | Freq: Every day | ORAL | 0 refills | Status: DC

## 2018-04-14 MED ORDER — FAMOTIDINE 20 MG PO TABS: 20.0000 mg | ORAL_TABLET | Freq: Every day | ORAL | 1 refills | Status: AC

## 2018-04-14 NOTE — Patient Instructions (Signed)
Take omeprazole or prilosec 30 minutes before breakfast for two weeks and then stop it.   Take pepcid 20 mg in the evening or at bedtime daily - continue this long term for now.    A thyroid US was ordered.    Call if symptoms do not improvement       Gastroesophageal Reflux Disease, Adult Normally, food travels down the esophagus and stays in the stomach to be digested. However, when a person has gastroesophageal reflux disease (GERD), food and stomach acid move back up into the esophagus. When this happens, the esophagus becomes sore and inflamed. Over time, GERD can create small holes (ulcers) in the lining of the esophagus. What are the causes? This condition is caused by a problem with the muscle between the esophagus and the stomach (lower esophageal sphincter, or LES). Normally, the LES muscle closes after food passes through the esophagus to the stomach. When the LES is weakened or abnormal, it does not close properly, and that allows food and stomach acid to go back up into the esophagus. The LES can be weakened by certain dietary substances, medicines, and medical conditions, including:  Tobacco use.  Pregnancy.  Having a hiatal hernia.  Heavy alcohol use.  Certain foods and beverages, such as coffee, chocolate, onions, and peppermint.  What increases the risk? This condition is more likely to develop in:  People who have an increased body weight.  People who have connective tissue disorders.  People who use NSAID medicines.  What are the signs or symptoms? Symptoms of this condition include:  Heartburn.  Difficult or painful swallowing.  The feeling of having a lump in the throat.  Abitter taste in the mouth.  Bad breath.  Having a large amount of saliva.  Having an upset or bloated stomach.  Belching.  Chest pain.  Shortness of breath or wheezing.  Ongoing (chronic) cough or a night-time cough.  Wearing away of tooth enamel.  Weight  loss.  Different conditions can cause chest pain. Make sure to see your health care provider if you experience chest pain. How is this diagnosed? Your health care provider will take a medical history and perform a physical exam. To determine if you have mild or severe GERD, your health care provider may also monitor how you respond to treatment. You may also have other tests, including:  An endoscopy toexamine your stomach and esophagus with a small camera.  A test thatmeasures the acidity level in your esophagus.  A test thatmeasures how much pressure is on your esophagus.  A barium swallow or modified barium swallow to show the shape, size, and functioning of your esophagus.  How is this treated? The goal of treatment is to help relieve your symptoms and to prevent complications. Treatment for this condition may vary depending on how severe your symptoms are. Your health care provider may recommend:  Changes to your diet.  Medicine.  Surgery.  Follow these instructions at home: Diet  Follow a diet as recommended by your health care provider. This may involve avoiding foods and drinks such as: ? Coffee and tea (with or without caffeine). ? Drinks that containalcohol. ? Energy drinks and sports drinks. ? Carbonated drinks or sodas. ? Chocolate and cocoa. ? Peppermint and mint flavorings. ? Garlic and onions. ? Horseradish. ? Spicy and acidic foods, including peppers, chili powder, curry powder, vinegar, hot sauces, and barbecue sauce. ? Citrus fruit juices and citrus fruits, such as oranges, lemons, and limes. ? Tomato-based foods,  such as red sauce, chili, salsa, and pizza with red sauce. ? Fried and fatty foods, such as donuts, french fries, potato chips, and high-fat dressings. ? High-fat meats, such as hot dogs and fatty cuts of red and white meats, such as rib eye steak, sausage, ham, and bacon. ? High-fat dairy items, such as whole milk, butter, and cream  cheese.  Eat small, frequent meals instead of large meals.  Avoid drinking large amounts of liquid with your meals.  Avoid eating meals during the 2-3 hours before bedtime.  Avoid lying down right after you eat.  Do not exercise right after you eat. General instructions  Pay attention to any changes in your symptoms.  Take over-the-counter and prescription medicines only as told by your health care provider. Do not take aspirin, ibuprofen, or other NSAIDs unless your health care provider told you to do so.  Do not use any tobacco products, including cigarettes, chewing tobacco, and e-cigarettes. If you need help quitting, ask your health care provider.  Wear loose-fitting clothing. Do not wear anything tight around your waist that causes pressure on your abdomen.  Raise (elevate) the head of your bed 6 inches (15cm).  Try to reduce your stress, such as with yoga or meditation. If you need help reducing stress, ask your health care provider.  If you are overweight, reduce your weight to an amount that is healthy for you. Ask your health care provider for guidance about a safe weight loss goal.  Keep all follow-up visits as told by your health care provider. This is important. Contact a health care provider if:  You have new symptoms.  You have unexplained weight loss.  You have difficulty swallowing, or it hurts to swallow.  You have wheezing or a persistent cough.  Your symptoms do not improve with treatment.  You have a hoarse voice. Get help right away if:  You have pain in your arms, neck, jaw, teeth, or back.  You feel sweaty, dizzy, or light-headed.  You have chest pain or shortness of breath.  You vomit and your vomit looks like blood or coffee grounds.  You faint.  Your stool is bloody or black.  You cannot swallow, drink, or eat. This information is not intended to replace advice given to you by your health care provider. Make sure you discuss any  questions you have with your health care provider. Document Released: 02/18/2005 Document Revised: 10/09/2015 Document Reviewed: 09/05/2014 Elsevier Interactive Patient Education  [employer].

## 2018-04-14 NOTE — Assessment & Plan Note (Signed)
She had an ultrasound done in 2016 that showed a left-sided thyroid nodule-needs follow-up Thyroid ultrasound ordered Her symptoms today are likely related to the thyroid nodule

## 2018-04-14 NOTE — Assessment & Plan Note (Signed)
Symptoms consistent with probable reflux/silent reflux Start omeprazole 20 mg daily 30 minutes before breakfast for 2 weeks Also take Pepcid 20 mg daily at bedtime She will monitor closely to see if her symptoms improve GERD diet She will call if her symptoms do not improve or worsen so we can evaluate further

## 2018-04-15 ENCOUNTER — Telehealth: Payer: Self-pay

## 2018-04-15 NOTE — Telephone Encounter (Signed)
PA initiated for omeprazole  KEY:[id number]

## 2018-04-18 ENCOUNTER — Ambulatory Visit
Admission: RE | Admit: 2018-04-18 | Discharge: 2018-04-18 | Disposition: A | Discharge status: Home / Self Care | Payer: 59 | Source: Ambulatory Visit | Attending: Internal Medicine | Admitting: Internal Medicine

## 2018-04-18 DIAGNOSIS — E041 Nontoxic single thyroid nodule: Secondary | ICD-10-CM

## 2018-04-18 DIAGNOSIS — E042 Nontoxic multinodular goiter: Secondary | ICD-10-CM | POA: Diagnosis not present

## 2018-04-19 ENCOUNTER — Encounter: Payer: Self-pay | Admitting: Internal Medicine

## 2018-04-27 DIAGNOSIS — J324 Chronic pansinusitis: Secondary | ICD-10-CM | POA: Diagnosis not present

## 2018-04-27 DIAGNOSIS — J45909 Unspecified asthma, uncomplicated: Secondary | ICD-10-CM | POA: Diagnosis not present

## 2018-04-27 DIAGNOSIS — J339 Nasal polyp, unspecified: Secondary | ICD-10-CM | POA: Diagnosis not present

## 2018-05-02 ENCOUNTER — Encounter: Payer: Self-pay | Admitting: Internal Medicine

## 2018-05-02 DIAGNOSIS — R059 Cough, unspecified: Secondary | ICD-10-CM

## 2018-05-02 DIAGNOSIS — R05 Cough: Secondary | ICD-10-CM

## 2018-05-12 ENCOUNTER — Encounter: Payer: Self-pay | Admitting: Internal Medicine

## 2018-05-12 ENCOUNTER — Telehealth: Payer: Self-pay | Admitting: Emergency Medicine

## 2018-05-12 NOTE — Telephone Encounter (Signed)
Pt is requesting status of referral to ENT. Will you please look into this.

## 2018-05-12 NOTE — Telephone Encounter (Signed)
Pt scheduled and she is aware.

## 2018-05-13 DIAGNOSIS — R1313 Dysphagia, pharyngeal phase: Secondary | ICD-10-CM | POA: Diagnosis not present

## 2018-05-13 DIAGNOSIS — J300 Vasomotor rhinitis: Secondary | ICD-10-CM | POA: Diagnosis not present

## 2018-05-26 ENCOUNTER — Encounter: Payer: Self-pay | Admitting: Internal Medicine

## 2018-05-26 ENCOUNTER — Ambulatory Visit (INDEPENDENT_AMBULATORY_CARE_PROVIDER_SITE_OTHER): Payer: 59 | Admitting: Internal Medicine

## 2018-05-26 DIAGNOSIS — R6889 Other general symptoms and signs: Secondary | ICD-10-CM | POA: Diagnosis not present

## 2018-05-26 LAB — POC INFLUENZA A&B (BINAX/QUICKVUE)
Influenza A, POC: NEGATIVE
Influenza B, POC: NEGATIVE

## 2018-05-26 MED ORDER — METHYLPREDNISOLONE ACETATE 40 MG/ML IJ SUSP
40.0000 mg | Freq: Once | INTRAMUSCULAR | Status: AC
  Administered 2018-05-26: 40 mg via INTRAMUSCULAR

## 2018-05-26 NOTE — Progress Notes (Signed)
   Subjective:   Patient ID: Tara Carpenter, female    DOB: 08-23-1960, 58 y.o.   MRN: [id number]  HPI The patient is a 58 y.o. female coming in for cold symptoms. Started Monday evening. Main symptoms are: chills, fevers, cough, sinus congestion. Denies nausea or vomiting. Having a lot of fatigue, more SOB, using albuterol more. Overall it is worsening. Has tried albuterol, symbicort and zyrtec and singulair which she is still taking. She is worried about asthma flare. Exposed to sick contact of her husband who was sick last week.   Review of Systems  Constitutional: Positive for activity change, appetite change, chills, fatigue and fever. Negative for unexpected weight change.  HENT: Positive for congestion, postnasal drip, rhinorrhea, sinus pressure and sore throat. Negative for ear discharge, ear pain, sinus pain, sneezing, tinnitus, trouble swallowing and voice change.   Eyes: Negative.   Respiratory: Positive for cough and shortness of breath. Negative for chest tightness and wheezing.   Cardiovascular: Negative.   Gastrointestinal: Negative.   Musculoskeletal: Positive for myalgias.  Neurological: Positive for headaches.    Objective:  Physical Exam Constitutional:      Appearance: She is well-developed.  HENT:     Head: Normocephalic and atraumatic.     Comments: Oropharynx with redness and clear drainage, nose with swollen turbinates, TMs normal bilaterally.  Neck:     Musculoskeletal: Normal range of motion.     Thyroid: No thyromegaly.  Cardiovascular:     Rate and Rhythm: Normal rate and regular rhythm.  Pulmonary:     Effort: Pulmonary effort is normal. No respiratory distress.     Breath sounds: Normal breath sounds. No wheezing or rales.  Abdominal:     Palpations: Abdomen is soft.  Musculoskeletal:        General: Tenderness present.  Lymphadenopathy:     Cervical: No cervical adenopathy.  Skin:    General: Skin is warm and dry.  Neurological:     Mental  Status: She is alert and oriented to person, place, and time.     Vitals:   05/26/18 1055  BP: 112/70  Pulse: 75  Temp: 98.9 F (37.2 C)  TempSrc: Oral  SpO2: 95%  Weight: 136 lb (61.7 kg)  Height: 5\' 2"  (1.575 m)    Assessment & Plan:  Depo-medrol 40 mg IM given at visit

## 2018-05-26 NOTE — Patient Instructions (Signed)
You do not have the flu.   We have given you a shot today to help the breathing.   If not feeling better by Mon/Tuesday call us back.

## 2018-05-26 NOTE — Assessment & Plan Note (Addendum)
Flu testing done for concurrent asthma although outside window for tamiflu which is negative. Given depo-medrol 40 mg IM. No antibiotics are indicated today. Note given as she is supposed to fly tomorrow and still running fevers today.

## 2018-05-27 ENCOUNTER — Encounter: Payer: Self-pay | Admitting: Internal Medicine

## 2018-05-30 ENCOUNTER — Encounter: Payer: Self-pay | Admitting: Internal Medicine

## 2018-05-30 NOTE — Progress Notes (Signed)
Subjective:    Patient ID: Tara Carpenter, female    DOB: 1960/07/10, 58 y.o.   MRN: [id number]   HPI The patient is here for an acute visit.   Cough: She was seen 1/2 for cold symptoms.  She had fever, chills, congestion, sinus pressure, sore throat, cough and shortness of breath.  She was diagnosed with flulike symptoms, but rapid flu test was negative.  She did have asthma-like symptoms and received a Depo-Medrol 40 mg IM.  No antibiotics were thought to be necessary.  She does not feel that the steroid injection helped.  She is using the albuterol inhaler frequently and that does seem to help.  She knows she is overusing it.  She is also tried Delsym, which has not been effective.  She is taking Tylenol.  She has felt feverish and has a low-grade fever here today.She states intermittent nasal congestion, postnasal drip, sore throat from the drainage and coughing, chest tightness with exertion, dry cough, shortness of breath and headaches.  She denies any significant sinus pain, but on occasion may have some.  She denies any wheezing or ear pain.  She does have a history of significant sinus issues and has had surgery in the past.  She follows with ENT.  GERD: She does have reflux and has some symptoms on occasion.  She sometimes feels some pain in her throat during the day.  She wonders if the reflux is possibly contributing to her current symptoms.  Medications and allergies reviewed with patient and updated if appropriate.  Patient Active Problem List   Diagnosis Date Noted  . Flu-like symptoms 05/26/2018  . Thyroid nodule - no FU needed 04/14/2018  . Gastroesophageal reflux disease 04/14/2018  . Nasal polyps 06/24/2016  . Arthralgia 06/24/2016  . Easy bruising 01/05/2015  . Dyspnea 08/07/2014  . Family hx of colon cancer 04/18/2012  . Osteopenia 02/10/2012  . Hyperlipidemia 05/29/2011  . Family history of ischemic heart disease 03/24/2011  . Sinusitis, chronic 12/01/2007    . Cough variant asthma 12/01/2007  . DIABETES MELLITUS, GESTATIONAL, HX OF 12/01/2007    Current Outpatient Medications on File Prior to Visit  Medication Sig Dispense Refill  . albuterol (PROAIR HFA) 108 (90 Base) MCG/ACT inhaler Inhale 1-2 puffs into the lungs every 6 (six) hours as needed for wheezing or shortness of breath. 1 Inhaler 5  . budesonide (PULMICORT) 0.25 MG/2ML nebulizer solution Take 0.25 mg by nebulization daily. Nasal rinse    . budesonide-formoterol (SYMBICORT) 160-4.5 MCG/ACT inhaler Inhale 2 puffs into the lungs 2 (two) times daily. 1 Inhaler 11  . Calcium Carbonate-Vitamin D (CALCIUM + D PO) Take 1 tablet by mouth daily.     . Cetirizine HCl (ZYRTEC ALLERGY PO) Take 1 tablet by mouth daily.     . famotidine (PEPCID) 20 MG tablet Take 1 tablet (20 mg total) by mouth at bedtime. 90 tablet 1  . Montelukast Sodium (SINGULAIR PO) Take 1 tablet by mouth daily.     . Multiple Vitamin (MULTIVITAMIN) capsule Take 1 capsule by mouth daily.      Marland Kitchen omeprazole (PRILOSEC) 20 MG capsule Take 1 capsule (20 mg total) by mouth daily. 14 capsule 0   No current facility-administered medications on file prior to visit.     Past Medical History:  Diagnosis Date  . Colon polyps 02/20/2010   Tubular adenoma, Hyperplastic  . Rosacea     Past Surgical History:  Procedure Laterality Date  . NASAL SINUS SURGERY  x 3  . TEMPOROMANDIBULAR JOINT SURGERY      Social History   Socioeconomic History  . Marital status: Married    Spouse name: Not on file  . Number of children: Not on file  . Years of education: Not on file  . Highest education level: Not on file  Occupational History  . Not on file  Social Needs  . Financial resource strain: Not on file  . Food insecurity:    Worry: Not on file    Inability: Not on file  . Transportation needs:    Medical: Not on file    Non-medical: Not on file  Tobacco Use  . Smoking status: Never Smoker  . Smokeless tobacco: Never  Used  Substance and Sexual Activity  . Alcohol use: Yes    Alcohol/week: 6.0 standard drinks    Types: 6 Standard drinks or equivalent per week  . Drug use: No  . Sexual activity: Yes    Birth control/protection: Post-menopausal    Comment: Vasectomy-1st intercourse 58 yo-Fewer than 5 partners  Lifestyle  . Physical activity:    Days per week: Not on file    Minutes per session: Not on file  . Stress: Not on file  Relationships  . Social connections:    Talks on phone: Not on file    Gets together: Not on file    Attends religious service: Not on file    Active member of club or organization: Not on file    Attends meetings of clubs or organizations: Not on file    Relationship status: Not on file  Other Topics Concern  . Not on file  Social History Narrative   Exercising regularly    Family History  Problem Relation Age of Onset  . Diabetes Mother   . Colon cancer Mother   . Diabetes Father   . Heart disease Father   . Hyperlipidemia Sister   . Hyperlipidemia Brother   . Hyperlipidemia Brother   . Hyperlipidemia Brother   . Hyperlipidemia Sister   . Hyperlipidemia Sister     Review of Systems  Constitutional: Positive for fever (low fever). Negative for chills.  HENT: Positive for congestion (sometimes), postnasal drip and sore throat (from cough, drainage). Negative for ear pain and sinus pain.        Clears throat  Respiratory: Positive for cough (dry), chest tightness (with exertion) and shortness of breath. Negative for wheezing.   Neurological: Positive for headaches.       Objective:   Vitals:   05/31/18 1044  BP: 128/84  Pulse: 76  Resp: 16  Temp: 99.6 F (37.6 C)  SpO2: 98%   BP Readings from Last 3 Encounters:  05/31/18 128/84  05/26/18 112/70  04/14/18 130/82   Wt Readings from Last 3 Encounters:  05/31/18 133 lb 12.8 oz (60.7 kg)  05/26/18 136 lb (61.7 kg)  04/14/18 136 lb 6.4 oz (61.9 kg)   Body mass index is 24.47 kg/m.    Physical Exam    GENERAL APPEARANCE: Appears stated age, well appearing, NAD EYES: conjunctiva clear, no icterus HEENT: bilateral tympanic membranes and ear canals normal, oropharynx with no erythema, no thyromegaly, trachea midline, no cervical or supraclavicular lymphadenopathy LUNGS: Clear to auscultation without wheeze or crackles, unlabored breathing, good air entry bilaterally CARDIOVASCULAR: Normal S1,S2 without murmurs, no edema SKIN: Warm, dry      Assessment & Plan:    See Problem List for Assessment and Plan of chronic medical problems.

## 2018-05-31 ENCOUNTER — Ambulatory Visit (INDEPENDENT_AMBULATORY_CARE_PROVIDER_SITE_OTHER): Payer: 59 | Admitting: Internal Medicine

## 2018-05-31 ENCOUNTER — Encounter: Payer: Self-pay | Admitting: Internal Medicine

## 2018-05-31 VITALS — BP 128/84 | HR 76 | Temp 99.6°F | Resp 16 | Ht 62.0 in | Wt 133.8 lb

## 2018-05-31 DIAGNOSIS — J329 Chronic sinusitis, unspecified: Secondary | ICD-10-CM

## 2018-05-31 DIAGNOSIS — K219 Gastro-esophageal reflux disease without esophagitis: Secondary | ICD-10-CM | POA: Diagnosis not present

## 2018-05-31 MED ORDER — CEPHALEXIN 500 MG PO CAPS: 500.0000 mg | ORAL_CAPSULE | Freq: Three times a day (TID) | ORAL | 0 refills | Status: DC

## 2018-05-31 MED ORDER — HYDROCOD POLST-CPM POLST ER 10-8 MG/5ML PO SUER: 5.0000 mL | Freq: Two times a day (BID) | ORAL | 0 refills | Status: DC | PRN

## 2018-05-31 MED ORDER — OMEPRAZOLE 40 MG PO CPDR: 40.0000 mg | DELAYED_RELEASE_CAPSULE | Freq: Every day | ORAL | 3 refills | Status: DC

## 2018-05-31 NOTE — Assessment & Plan Note (Addendum)
No significant sinus pressure, but symptoms and history consistent with probable sinus infection Start Keflex 3 times daily x14 days Tussionex cough syrup Continue albuterol inhaler as needed Over-the-counter cold medications as needed Continue rest and fluids Call if no improvement

## 2018-05-31 NOTE — Assessment & Plan Note (Signed)
?    Adequately controlled We will increase omeprazole to 40 mg daily-take 2-4 weeks to see if it improves symptoms and then taper down to 20 mg daily

## 2018-05-31 NOTE — Patient Instructions (Addendum)
Take the antibiotic as prescribed - complete the entire course.  Use the cough syrup as needed.  Try omeprazole 40 mg daily for 2-4 weeks and then decrease back to 20 mg daily.    Continue over the counter cold medication, advil and tylenol.  Increase your fluids and rest.    Call if no improvement

## 2018-06-01 ENCOUNTER — Encounter: Payer: 59 | Admitting: Women's Health

## 2018-06-22 ENCOUNTER — Other Ambulatory Visit: Payer: Self-pay | Admitting: Internal Medicine

## 2018-06-22 DIAGNOSIS — J45991 Cough variant asthma: Secondary | ICD-10-CM

## 2018-07-05 ENCOUNTER — Encounter: Payer: 59 | Admitting: Women's Health

## 2018-07-19 ENCOUNTER — Encounter: Payer: Self-pay | Admitting: Women's Health

## 2018-07-19 ENCOUNTER — Ambulatory Visit (INDEPENDENT_AMBULATORY_CARE_PROVIDER_SITE_OTHER): Payer: 59 | Admitting: Women's Health

## 2018-07-19 VITALS — BP 124/78 | Ht 62.0 in | Wt 131.0 lb

## 2018-07-19 DIAGNOSIS — Z01419 Encounter for gynecological examination (general) (routine) without abnormal findings: Secondary | ICD-10-CM

## 2018-07-19 DIAGNOSIS — E041 Nontoxic single thyroid nodule: Secondary | ICD-10-CM

## 2018-07-19 DIAGNOSIS — Z13820 Encounter for screening for osteoporosis: Secondary | ICD-10-CM

## 2018-07-19 DIAGNOSIS — M8580 Other specified disorders of bone density and structure, unspecified site: Secondary | ICD-10-CM

## 2018-07-19 DIAGNOSIS — Z13220 Encounter for screening for lipoid disorders: Secondary | ICD-10-CM

## 2018-07-19 NOTE — Patient Instructions (Signed)
Health Maintenance for Postmenopausal Women Menopause is a normal process in which your reproductive ability comes to an end. This process happens gradually over a span of months to years, usually between the ages of 62 and 89. Menopause is complete when you have missed 12 consecutive menstrual periods. It is important to talk with your health care provider about some of the most common conditions that affect postmenopausal women, such as heart disease, cancer, and bone loss (osteoporosis). Adopting a healthy lifestyle and getting preventive care can help to promote your health and wellness. Those actions can also lower your chances of developing some of these common conditions. What should I know about menopause? During menopause, you may experience a number of symptoms, such as:  Moderate-to-severe hot flashes.  Night sweats.  Decrease in sex drive.  Mood swings.  Headaches.  Tiredness.  Irritability.  Memory problems.  Insomnia. Choosing to treat or not to treat menopausal changes is an individual decision that you make with your health care provider. What should I know about hormone replacement therapy and supplements? Hormone therapy products are effective for treating symptoms that are associated with menopause, such as hot flashes and night sweats. Hormone replacement carries certain risks, especially as you become older. If you are thinking about using estrogen or estrogen with progestin treatments, discuss the benefits and risks with your health care provider. What should I know about heart disease and stroke? Heart disease, heart attack, and stroke become more likely as you age. This may be due, in part, to the hormonal changes that your body experiences during menopause. These can affect how your body processes dietary fats, triglycerides, and cholesterol. Heart attack and stroke are both medical emergencies. There are many things that you can do to help prevent heart disease  and stroke:  Have your blood pressure checked at least every 1-2 years. High blood pressure causes heart disease and increases the risk of stroke.  If you are 79-72 years old, ask your health care provider if you should take aspirin to prevent a heart attack or a stroke.  Do not use any tobacco products, including cigarettes, chewing tobacco, or electronic cigarettes. If you need help quitting, ask your health care provider.  It is important to eat a healthy diet and maintain a healthy weight. ? Be sure to include plenty of vegetables, fruits, low-fat dairy products, and lean protein. ? Avoid eating foods that are high in solid fats, added sugars, or salt (sodium).  Get regular exercise. This is one of the most important things that you can do for your health. ? Try to exercise for at least 150 minutes each week. The type of exercise that you do should increase your heart rate and make you sweat. This is known as moderate-intensity exercise. ? Try to do strengthening exercises at least twice each week. Do these in addition to the moderate-intensity exercise.  Know your numbers.Ask your health care provider to check your cholesterol and your blood glucose. Continue to have your blood tested as directed by your health care provider.  What should I know about cancer screening? There are several types of cancer. Take the following steps to reduce your risk and to catch any cancer development as early as possible. Breast Cancer  Practice breast self-awareness. ? This means understanding how your breasts normally appear and feel. ? It also means doing regular breast self-exams. Let your health care provider know about any changes, no matter how small.  If you are 40 or  older, have a clinician do a breast exam (clinical breast exam or CBE) every year. Depending on your age, family history, and medical history, it may be recommended that you also have a yearly breast X-ray (mammogram).  If you  have a family history of breast cancer, talk with your health care provider about genetic screening.  If you are at high risk for breast cancer, talk with your health care provider about having an MRI and a mammogram every year.  Breast cancer (BRCA) gene test is recommended for women who have family members with BRCA-related cancers. Results of the assessment will determine the need for genetic counseling and BRCA1 and for BRCA2 testing. BRCA-related cancers include these types: ? Breast. This occurs in males or females. ? Ovarian. ? Tubal. This may also be called fallopian tube cancer. ? Cancer of the abdominal or pelvic lining (peritoneal cancer). ? Prostate. ? Pancreatic. Cervical, Uterine, and Ovarian Cancer Your health care provider may recommend that you be screened regularly for cancer of the pelvic organs. These include your ovaries, uterus, and vagina. This screening involves a pelvic exam, which includes checking for microscopic changes to the surface of your cervix (Pap test).  For women ages 21-65, health care providers may recommend a pelvic exam and a Pap test every three years. For women ages 39-65, they may recommend the Pap test and pelvic exam, combined with testing for human papilloma virus (HPV), every five years. Some types of HPV increase your risk of cervical cancer. Testing for HPV may also be done on women of any age who have unclear Pap test results.  Other health care providers may not recommend any screening for nonpregnant women who are considered low risk for pelvic cancer and have no symptoms. Ask your health care provider if a screening pelvic exam is right for you.  If you have had past treatment for cervical cancer or a condition that could lead to cancer, you need Pap tests and screening for cancer for at least 20 years after your treatment. If Pap tests have been discontinued for you, your risk factors (such as having a new sexual partner) need to be reassessed  to determine if you should start having screenings again. Some women have medical problems that increase the chance of getting cervical cancer. In these cases, your health care provider may recommend that you have screening and Pap tests more often.  If you have a family history of uterine cancer or ovarian cancer, talk with your health care provider about genetic screening.  If you have vaginal bleeding after reaching menopause, tell your health care provider.  There are currently no reliable tests available to screen for ovarian cancer. Lung Cancer Lung cancer screening is recommended for adults 57-50 years old who are at high risk for lung cancer because of a history of smoking. A yearly low-dose CT scan of the lungs is recommended if you:  Currently smoke.  Have a history of at least 30 pack-years of smoking and you currently smoke or have quit within the past 15 years. A pack-year is smoking an average of one pack of cigarettes per day for one year. Yearly screening should:  Continue until it has been 15 years since you quit.  Stop if you develop a health problem that would prevent you from having lung cancer treatment. Colorectal Cancer  This type of cancer can be detected and can often be prevented.  Routine colorectal cancer screening usually begins at age 12 and continues through  age 75.  If you have risk factors for colon cancer, your health care provider may recommend that you be screened at an earlier age.  If you have a family history of colorectal cancer, talk with your health care provider about genetic screening.  Your health care provider may also recommend using home test kits to check for hidden blood in your stool.  A small camera at the end of a tube can be used to examine your colon directly (sigmoidoscopy or colonoscopy). This is done to check for the earliest forms of colorectal cancer.  Direct examination of the colon should be repeated every 5-10 years until  age 75. However, if early forms of precancerous polyps or small growths are found or if you have a family history or genetic risk for colorectal cancer, you may need to be screened more often. Skin Cancer  Check your skin from head to toe regularly.  Monitor any moles. Be sure to tell your health care provider: ? About any new moles or changes in moles, especially if there is a change in a mole's shape or color. ? If you have a mole that is larger than the size of a pencil eraser.  If any of your family members has a history of skin cancer, especially at a young age, talk with your health care provider about genetic screening.  Always use sunscreen. Apply sunscreen liberally and repeatedly throughout the day.  Whenever you are outside, protect yourself by wearing long sleeves, pants, a wide-brimmed hat, and sunglasses. What should I know about osteoporosis? Osteoporosis is a condition in which bone destruction happens more quickly than new bone creation. After menopause, you may be at an increased risk for osteoporosis. To help prevent osteoporosis or the bone fractures that can happen because of osteoporosis, the following is recommended:  If you are 19-50 years old, get at least 1,000 mg of calcium and at least 600 mg of vitamin D per day.  If you are older than age 50 but younger than age 70, get at least 1,200 mg of calcium and at least 600 mg of vitamin D per day.  If you are older than age 70, get at least 1,200 mg of calcium and at least 800 mg of vitamin D per day. Smoking and excessive alcohol intake increase the risk of osteoporosis. Eat foods that are rich in calcium and vitamin D, and do weight-bearing exercises several times each week as directed by your health care provider. What should I know about how menopause affects my mental health? Depression may occur at any age, but it is more common as you become older. Common symptoms of depression include:  Low or sad  mood.  Changes in sleep patterns.  Changes in appetite or eating patterns.  Feeling an overall lack of motivation or enjoyment of activities that you previously enjoyed.  Frequent crying spells. Talk with your health care provider if you think that you are experiencing depression. What should I know about immunizations? It is important that you get and maintain your immunizations. These include:  Tetanus, diphtheria, and pertussis (Tdap) booster vaccine.  Influenza every year before the flu season begins.  Pneumonia vaccine.  Shingles vaccine. Your health care provider may also recommend other immunizations. This information is not intended to replace advice given to you by your health care provider. Make sure you discuss any questions you have with your health care provider. Document Released: 07/03/2005 Document Revised: 11/29/2015 Document Reviewed: 02/12/2015 Elsevier Interactive Patient Education    2019 [place].

## 2018-07-19 NOTE — Progress Notes (Signed)
Tara Carpenter July 09, 1960 [id number]    History:    Presents for annual exam.  Postmenopausal on no HRT with no bleeding.  Normal Pap and mammogram history.  September 07, 2015- colonoscopy, mother deceased from colon cancer.  Sep 07, 2015 T score -2.2 at right hip FRAX 12% / 2.1%.  History of elevated triglycerides unable to tolerate statins or Lovaza.  09-07-15 nasal polyps.  Thyroid nodule with negative ultrasound 09-06-2017.  GDM with one pregnancy.  06-Sep-2017 hearing loss evaluation and will probably proceed with hearing aid.  Also has had increased problems with GERD.  Has seen specialists.  Has lost 10 pounds in the past year with weight watchers.  Past medical history, past surgical history, family history and social history were all reviewed and documented in the EPIC chart.  Works part-time as a Garment/textile technologist.  3 children all doing well, daughter senior in college, 2 sons graduated from college living in [place].  ROS:  A ROS was performed and pertinent positives and negatives are included.  Exam:  Vitals:   07/19/18 1616  BP: 124/78  Weight: 131 lb (59.4 kg)  Height: 5\' 2"  (1.575 m)   Body mass index is 23.96 kg/m.   General appearance:  Normal Thyroid:  Symmetrical, normal in size, without palpable masses or nodularity.  History of nodules none palpated Respiratory  Auscultation:  Clear without wheezing or rhonchi Cardiovascular  Auscultation:  Regular rate, without rubs, murmurs or gallops  Edema/varicosities:  Not grossly evident Abdominal  Soft,nontender, without masses, guarding or rebound.  Liver/spleen:  No organomegaly noted  Hernia:  None appreciated  Skin  Inspection:  Grossly normal   Breasts: Examined lying and sitting.     Right: Without masses, retractions, discharge or axillary adenopathy.     Left: Without masses, retractions, discharge or axillary adenopathy. Gentitourinary   Inguinal/mons:  Normal without inguinal adenopathy  External genitalia:  Normal  BUS/Urethra/Skene's glands:   Normal  Vagina:  Normal  Cervix:  Normal  Uterus:  normal in size, shape and contour.  Midline and mobile  Adnexa/parametria:     Rt: Without masses or tenderness.   Lt: Without masses or tenderness.  Anus and perineum: Normal  Digital rectal exam: Normal sphincter tone without palpated masses or tenderness  Assessment/Plan:  58 y.o. MWF G4, P3 for annual exam with no GYN complaints.  Postmenopausal/no HRT/no bleeding Osteopenia without elevated FRAX Hearing loss does have follow-up scheduled with ENT thyroid nodule endocrinologist managing  Plan: Repeat DEXA, instructed to schedule.  Continue active lifestyle of regular weightbearing and balance type exercise, yoga encouraged.  SBEs, continue annual screening mammogram due has scheduled.  Calcium rich foods, vitamin D 2000 daily encouraged.  Congratulated on 10 pound weight loss.  Will return to office fasting for CBC, CMP, lipid panel, vitamin D and TSH.  Pap with HR HPV typing, new screening guidelines reviewed.    [place], 5:04 PM 07/19/2018

## 2018-07-20 ENCOUNTER — Other Ambulatory Visit: Payer: 59

## 2018-07-20 DIAGNOSIS — M8580 Other specified disorders of bone density and structure, unspecified site: Secondary | ICD-10-CM

## 2018-07-20 DIAGNOSIS — R7309 Other abnormal glucose: Secondary | ICD-10-CM | POA: Diagnosis not present

## 2018-07-20 DIAGNOSIS — E041 Nontoxic single thyroid nodule: Secondary | ICD-10-CM | POA: Diagnosis not present

## 2018-07-20 DIAGNOSIS — Z01419 Encounter for gynecological examination (general) (routine) without abnormal findings: Secondary | ICD-10-CM

## 2018-07-20 DIAGNOSIS — Z13220 Encounter for screening for lipoid disorders: Secondary | ICD-10-CM

## 2018-07-20 NOTE — Addendum Note (Signed)
Addended by: Lorine Bears on: 07/20/2018 08:06 AM   Modules accepted: Orders

## 2018-07-22 ENCOUNTER — Encounter: Payer: Self-pay | Admitting: *Deleted

## 2018-07-22 LAB — CBC WITH DIFFERENTIAL/PLATELET
Absolute Monocytes: 279 cells/uL (ref 200–950)
BASOS ABS: 39 {cells}/uL (ref 0–200)
Basophils Relative: 0.8 %
Eosinophils Absolute: 221 cells/uL (ref 15–500)
Eosinophils Relative: 4.5 %
HCT: 39.8 % (ref 35.0–45.0)
Hemoglobin: 13.4 g/dL (ref 11.7–15.5)
Lymphs Abs: 1132 cells/uL (ref 850–3900)
MCH: 31.7 pg (ref 27.0–33.0)
MCHC: 33.7 g/dL (ref 32.0–36.0)
MCV: 94.1 fL (ref 80.0–100.0)
MPV: 10.5 fL (ref 7.5–12.5)
Monocytes Relative: 5.7 %
Neutro Abs: 3229 cells/uL (ref 1500–7800)
Neutrophils Relative %: 65.9 %
Platelets: 295 10*3/uL (ref 140–400)
RBC: 4.23 10*6/uL (ref 3.80–5.10)
RDW: 12.4 % (ref 11.0–15.0)
Total Lymphocyte: 23.1 %
WBC: 4.9 10*3/uL (ref 3.8–10.8)

## 2018-07-22 LAB — COMPREHENSIVE METABOLIC PANEL
AG Ratio: 1.8 (calc) (ref 1.0–2.5)
ALT: 11 U/L (ref 6–29)
AST: 15 U/L (ref 10–35)
Albumin: 4.4 g/dL (ref 3.6–5.1)
Alkaline phosphatase (APISO): 60 U/L (ref 37–153)
BUN: 15 mg/dL (ref 7–25)
CO2: 24 mmol/L (ref 20–32)
Calcium: 10 mg/dL (ref 8.6–10.4)
Chloride: 105 mmol/L (ref 98–110)
Creat: 0.9 mg/dL (ref 0.50–1.05)
Globulin: 2.5 g/dL (calc) (ref 1.9–3.7)
Glucose, Bld: 124 mg/dL — ABNORMAL HIGH (ref 65–99)
Potassium: 5.1 mmol/L (ref 3.5–5.3)
Sodium: 140 mmol/L (ref 135–146)
Total Bilirubin: 0.5 mg/dL (ref 0.2–1.2)
Total Protein: 6.9 g/dL (ref 6.1–8.1)

## 2018-07-22 LAB — LIPID PANEL
Cholesterol: 292 mg/dL — ABNORMAL HIGH (ref <200)
HDL: 86 mg/dL (ref >=50)
LDL Cholesterol (Calc): 186 mg/dL (calc) — ABNORMAL HIGH
Non-HDL Cholesterol (Calc): 206 mg/dL (calc) — ABNORMAL HIGH (ref <130)
TRIGLYCERIDES: 87 mg/dL (ref <150)
Total CHOL/HDL Ratio: 3.4 (calc) (ref <5.0)

## 2018-07-22 LAB — PAP, TP IMAGING W/ HPV RNA, RFLX HPV TYPE 16,18/45: HPV DNA High Risk: NOT DETECTED

## 2018-07-22 LAB — HEMOGLOBIN A1C
Hgb A1c MFr Bld: 5.9 % of total Hgb — ABNORMAL HIGH (ref <5.7)
Mean Plasma Glucose: 123 (calc)
eAG (mmol/L): 6.8 (calc)

## 2018-07-22 LAB — TEST AUTHORIZATION

## 2018-07-22 LAB — VITAMIN D 25 HYDROXY (VIT D DEFICIENCY, FRACTURES): VIT D 25 HYDROXY: 27 ng/mL — AB (ref 30–100)

## 2018-07-22 LAB — TSH: TSH: 1.5 mIU/L (ref 0.40–4.50)

## 2018-08-03 NOTE — Telephone Encounter (Signed)
Patient informed with my chart message. 

## 2018-08-25 ENCOUNTER — Other Ambulatory Visit: Payer: Self-pay | Admitting: Internal Medicine

## 2018-09-11 ENCOUNTER — Encounter: Payer: Self-pay | Admitting: Internal Medicine

## 2018-10-12 ENCOUNTER — Encounter: Payer: Self-pay | Admitting: Women's Health

## 2018-10-12 DIAGNOSIS — Z1231 Encounter for screening mammogram for malignant neoplasm of breast: Secondary | ICD-10-CM | POA: Diagnosis not present

## 2018-10-13 DIAGNOSIS — L718 Other rosacea: Secondary | ICD-10-CM | POA: Diagnosis not present

## 2018-10-13 DIAGNOSIS — L218 Other seborrheic dermatitis: Secondary | ICD-10-CM | POA: Diagnosis not present

## 2018-10-13 DIAGNOSIS — D225 Melanocytic nevi of trunk: Secondary | ICD-10-CM | POA: Diagnosis not present

## 2018-10-24 DIAGNOSIS — M8580 Other specified disorders of bone density and structure, unspecified site: Secondary | ICD-10-CM

## 2018-10-24 HISTORY — DX: Other specified disorders of bone density and structure, unspecified site: M85.80

## 2018-11-04 ENCOUNTER — Other Ambulatory Visit: Payer: Self-pay

## 2018-11-04 ENCOUNTER — Ambulatory Visit (INDEPENDENT_AMBULATORY_CARE_PROVIDER_SITE_OTHER): Payer: 59 | Admitting: Primary Care

## 2018-11-04 ENCOUNTER — Encounter: Payer: Self-pay | Admitting: Primary Care

## 2018-11-04 VITALS — BP 104/60 | HR 81 | Temp 98.1°F | Ht 62.5 in | Wt 130.8 lb

## 2018-11-04 DIAGNOSIS — J45991 Cough variant asthma: Secondary | ICD-10-CM

## 2018-11-04 LAB — CBC WITH DIFFERENTIAL/PLATELET
Basophils Absolute: 0 10*3/uL (ref 0.0–0.1)
Basophils Relative: 0.8 % (ref 0.0–3.0)
Eosinophils Absolute: 0.5 10*3/uL (ref 0.0–0.7)
Eosinophils Relative: 9.6 % — ABNORMAL HIGH (ref 0.0–5.0)
HCT: 40.1 % (ref 36.0–46.0)
Hemoglobin: 13.5 g/dL (ref 12.0–15.0)
Lymphocytes Relative: 23 % (ref 12.0–46.0)
Lymphs Abs: 1.2 10*3/uL (ref 0.7–4.0)
MCHC: 33.6 g/dL (ref 30.0–36.0)
MCV: 96.7 fl (ref 78.0–100.0)
Monocytes Absolute: 0.3 10*3/uL (ref 0.1–1.0)
Monocytes Relative: 6 % (ref 3.0–12.0)
Neutro Abs: 3.2 10*3/uL (ref 1.4–7.7)
Neutrophils Relative %: 60.6 % (ref 43.0–77.0)
Platelets: 279 10*3/uL (ref 150.0–400.0)
RBC: 4.15 Mil/uL (ref 3.87–5.11)
RDW: 12.3 % (ref 11.5–15.5)
WBC: 5.2 10*3/uL (ref 4.0–10.5)

## 2018-11-04 MED ORDER — PREDNISONE 10 MG PO TABS: ORAL_TABLET | ORAL | 0 refills | Status: DC

## 2018-11-04 MED ORDER — BUDESONIDE-FORMOTEROL FUMARATE 160-4.5 MCG/ACT IN AERO: 2.0000 | INHALATION_SPRAY | Freq: Two times a day (BID) | RESPIRATORY_TRACT | 11 refills | Status: DC

## 2018-11-04 NOTE — Progress Notes (Signed)
@Patient  ID: Tara Carpenter, female    DOB: 1961-04-03, 58 y.o.   MRN: [id number]  Chief Complaint  Patient presents with  . Follow-up    Asthma. C/O increase in non-productive cough over the last couple of months.Also using rescue inh 4-5x qd for wheezing.    Referring provider: Binnie Rail, MD  HPI: 58 year old female, never smoked. PMH significant for cough variant asthma, nasal polyps, GERD. Patient of Dr. Melvyn Novas, last seen on 06/30/17. Allergy profile 04/01/2017 >  Eos 0.4/  IgE 36  RAST neg. FENO 06/02/17 = 85 > rec take symb 160 2bid consistently.   11/04/2018 Patient presents today for asthma follow-up. Feels good today but complains of intermittent asthma symptoms over the last 4 weeks. Associated chest tightness, wheezing and sinus congestion. Treated for sinus infection end of April by Dr. Vinetta Bergamo office. Using her rescue inhaler more frequently up to 4 times a day. She has only been using her Symbicort in the morning. Using sinus wash with pulmicort once daily. Taking zyrtec and Singulair daily. Exercise and heat worsen her asthma symptoms. No fever.   Allergies  Allergen Reactions  . Amoxicillin-Pot Clavulanate Hives    Hives  . Aspirin     Aspirin sensitivity in the context of asthma and nasal polyps.  . Sulfamethoxazole-Trimethoprim Other (See Comments)    Joint pain "made me feel like I had the flu"  . Crestor [Rosuvastatin Calcium]     Severe joint pain    Immunization History  Administered Date(s) Administered  . Influenza,inj,Quad PF,6+ Mos 02/15/2013, 02/16/2014  . Td 12/01/2007    Past Medical History:  Diagnosis Date  . Colon polyps 02/20/2010   Tubular adenoma, Hyperplastic  . Rosacea     Tobacco History: Social History   Tobacco Use  Smoking Status Never Smoker  Smokeless Tobacco Never Used   Counseling given: Not Answered   Outpatient Medications Prior to Visit  Medication Sig Dispense Refill  . albuterol (PROAIR HFA) 108 (90 Base) MCG/ACT  inhaler Inhale 1-2 puffs into the lungs every 6 (six) hours as needed for wheezing or shortness of breath. 1 Inhaler 5  . budesonide (PULMICORT) 0.25 MG/2ML nebulizer solution Take 0.25 mg by nebulization daily. Nasal rinse    . Calcium Carbonate-Vitamin D (CALCIUM + D PO) Take 1 tablet by mouth daily.     . Cetirizine HCl (ZYRTEC ALLERGY PO) Take 1 tablet by mouth daily.     . famotidine (PEPCID) 20 MG tablet Take 1 tablet (20 mg total) by mouth at bedtime. 90 tablet 1  . Montelukast Sodium (SINGULAIR PO) Take 1 tablet by mouth daily.     . Multiple Vitamin (MULTIVITAMIN) capsule Take 1 capsule by mouth daily.      Marland Kitchen omeprazole (PRILOSEC) 20 MG capsule Take 1 capsule (20 mg total) by mouth daily. 14 capsule 0  . budesonide-formoterol (SYMBICORT) 160-4.5 MCG/ACT inhaler Inhale 2 puffs into the lungs 2 (two) times daily. 1 Inhaler 11   No facility-administered medications prior to visit.    Review of Systems  Review of Systems  Constitutional: Negative.   HENT: Positive for congestion and sinus pressure.   Respiratory: Positive for cough, shortness of breath and wheezing.   Cardiovascular: Negative.    Physical Exam  BP 104/60 (BP Location: Left Arm, Cuff Size: Normal)   Pulse 81   Temp 98.1 F (36.7 C) (Oral)   Ht 5' 2.5" (1.588 m)   Wt 130 lb 12.8 oz (59.3 kg)  LMP 07/15/2011   SpO2 98%   BMI 23.54 kg/m  Physical Exam Constitutional:      Appearance: She is well-developed.  HENT:     Head: Normocephalic and atraumatic.  Eyes:     Pupils: Pupils are equal, round, and reactive to light.  Neck:     Musculoskeletal: Normal range of motion and neck supple.  Cardiovascular:     Rate and Rhythm: Normal rate and regular rhythm.     Heart sounds: Normal heart sounds. No murmur.  Pulmonary:     Effort: Pulmonary effort is normal. No respiratory distress.     Breath sounds: Normal breath sounds. No wheezing.     Comments: CTA Musculoskeletal: Normal range of motion.  Skin:     General: Skin is warm and dry.     Findings: No erythema or rash.  Neurological:     General: No focal deficit present.     Mental Status: She is alert and oriented to person, place, and time. Mental status is at baseline.  Psychiatric:        Mood and Affect: Mood normal.        Behavior: Behavior normal.        Thought Content: Thought content normal.        Judgment: Judgment normal.      Lab Results:  CBC    Component Value Date/Time   WBC 5.2 11/04/2018 1436   RBC 4.15 11/04/2018 1436   HGB 13.5 11/04/2018 1436   HCT 40.1 11/04/2018 1436   PLT 279.0 11/04/2018 1436   MCV 96.7 11/04/2018 1436   MCH 31.7 07/20/2018 0941   MCHC 33.6 11/04/2018 1436   RDW 12.3 11/04/2018 1436   LYMPHSABS 1.2 11/04/2018 1436   MONOABS 0.3 11/04/2018 1436   EOSABS 0.5 11/04/2018 1436   BASOSABS 0.0 11/04/2018 1436    BMET    Component Value Date/Time   NA 140 07/20/2018 0941   K 5.1 07/20/2018 0941   CL 105 07/20/2018 0941   CO2 24 07/20/2018 0941   GLUCOSE 124 (H) 07/20/2018 0941   BUN 15 07/20/2018 0941   CREATININE 0.90 07/20/2018 0941   CALCIUM 10.0 07/20/2018 0941    BNP No results found for: BNP  ProBNP No results found for: PROBNP  Imaging: No results found.   Assessment & Plan:   Cough variant asthma - Advised patient take Symbicort 2 puffs TWICE daily (refills sent) - RX prednisone 20mg  x 5 days  - Continue Zyrtec and Singulair as prescribed - Eos absolute 0.5 today  - If continues to have frequent exacerbations despite taking inhalers as prescribed may consider biologic    Martyn Ehrich, NP 11/04/2018

## 2018-11-04 NOTE — Patient Instructions (Signed)
Continue sinus rinse daily Continue Zyrtec and singulair daily  Refill Symbicort - take two puffs TWICE daily  Rx- prednisone 20mg  x 5 days  If you develop sinus pain, color drainage or fever please call and let us know, will send in abx for sinusitis   Labs today   Follow up with Dr. Melvyn Novas   Asthma, Adult  Asthma is a long-term (chronic) condition that causes recurrent episodes in which the airways become tight and narrow. The airways are the passages that lead from the nose and mouth down into the lungs. Asthma episodes, also called asthma attacks, can cause coughing, wheezing, shortness of breath, and chest pain. The airways can also fill with mucus. During an attack, it can be difficult to breathe. Asthma attacks can range from minor to life threatening. Asthma cannot be cured, but medicines and lifestyle changes can help control it and treat acute attacks. What are the causes? This condition is believed to be caused by inherited (genetic) and environmental factors, but its exact cause is not known. There are many things that can bring on an asthma attack or make asthma symptoms worse (triggers). Asthma triggers are different for each person. Common triggers include:  Mold.  Dust.  Cigarette smoke.  Cockroaches.  Things that can cause allergy symptoms (allergens), such as animal dander or pollen from trees or grass.  Air pollutants such as household cleaners, wood smoke, smog, or Advertising account planner.  Cold air, weather changes, and winds (which increase molds and pollen in the air).  Strong emotional expressions such as crying or laughing hard.  Stress.  Certain medicines (such as aspirin) or types of medicines (such as beta-blockers).  Sulfites in foods and drinks. Foods and drinks that may contain sulfites include dried fruit, potato chips, and sparkling grape juice.  Infections or inflammatory conditions such as the flu, a cold, or inflammation of the nasal membranes  (rhinitis).  Gastroesophageal reflux disease (GERD).  Exercise or strenuous activity. What are the signs or symptoms? Symptoms of this condition may occur right after asthma is triggered or many hours later. Symptoms include:  Wheezing. This can sound like whistling when you breathe.  Excessive nighttime or early morning coughing.  Frequent or severe coughing with a common cold.  Chest tightness.  Shortness of breath.  Tiredness (fatigue) with minimal activity. How is this diagnosed? This condition is diagnosed based on:  Your medical history.  A physical exam.  Tests, which may include: ? Lung function studies and pulmonary studies (spirometry). These tests can evaluate the flow of air in your lungs. ? Allergy tests. ? Imaging tests, such as X-rays. How is this treated? There is no cure for this condition, but treatment can help control your symptoms. Treatment for asthma usually involves:  Identifying and avoiding your asthma triggers.  Using medicines to control your symptoms. Generally, two types of medicines are used to treat asthma: ? Controller medicines. These help prevent asthma symptoms from occurring. They are usually taken every day. ? Fast-acting reliever or rescue medicines. These quickly relieve asthma symptoms by widening the narrow and tight airways. They are used as needed and provide short-term relief.  Using supplemental oxygen. This may be needed during a severe episode.  Using other medicines, such as: ? Allergy medicines, such as antihistamines, if your asthma attacks are triggered by allergens. ? Immune medicines (immunomodulators). These are medicines that help control the immune system.  Creating an asthma action plan. An asthma action plan is a written plan for  managing and treating your asthma attacks. This plan includes: ? A list of your asthma triggers and how to avoid them. ? Information about when medicines should be taken and when their  dosage should be changed. ? Instructions about using a device called a peak flow meter. A peak flow meter measures how well the lungs are working and the severity of your asthma. It helps you monitor your condition. Follow these instructions at home: Controlling your home environment Control your home environment in the following ways to help avoid triggers and prevent asthma attacks:  Change your heating and air conditioning filter regularly.  Limit your use of fireplaces and wood stoves.  Get rid of pests (such as roaches and mice) and their droppings.  Throw away plants if you see mold on them.  Clean floors and dust surfaces regularly. Use unscented cleaning products.  Try to have someone else vacuum for you regularly. Stay out of rooms while they are being vacuumed and for a short while afterward. If you vacuum, use a dust mask from a hardware store, a double-layered or microfilter vacuum cleaner bag, or a vacuum cleaner with a HEPA filter.  Replace carpet with wood, tile, or vinyl flooring. Carpet can trap dander and dust.  Use allergy-proof pillows, mattress covers, and box spring covers.  Keep your bedroom a trigger-free room.  Avoid pets and keep windows closed when allergens are in the air.  Wash beddings every week in hot water and dry them in a dryer.  Use blankets that are made of polyester or cotton.  Clean bathrooms and kitchens with bleach. If possible, have someone repaint the walls in these rooms with mold-resistant paint. Stay out of the rooms that are being cleaned and painted.  Wash your hands often with soap and water. If soap and water are not available, use hand sanitizer.  Do not allow anyone to smoke in your home. General instructions  Take over-the-counter and prescription medicines only as told by your health care provider. ? Speak with your health care provider if you have questions about how or when to take the medicines. ? Make note if you are  requiring more frequent dosages.  Do not use any products that contain nicotine or tobacco, such as cigarettes and e-cigarettes. If you need help quitting, ask your health care provider. Also, avoid being exposed to secondhand smoke.  Use a peak flow meter as told by your health care provider. Record and keep track of the readings.  Understand and use the asthma action plan to help minimize, or stop an asthma attack, without needing to seek medical care.  Make sure you stay up to date on your yearly vaccinations as told by your health care provider. This may include vaccines for the flu and pneumonia.  Avoid outdoor activities when allergen counts are high and when air quality is low.  Wear a ski mask that covers your nose and mouth during outdoor winter activities. Exercise indoors on cold days if you can.  Warm up before exercising, and take time for a cool-down period after exercise.  Keep all follow-up visits as told by your health care provider. This is important. Where to find more information  For information about asthma, turn to the Centers for Disease Control and Prevention at http://www.clark.net/.htm  For air quality information, turn to [employer] at WeightRating.nl Contact a health care provider if:  You have wheezing, shortness of breath, or a cough even while you are taking medicine to prevent attacks.  The mucus you cough up (sputum) is thicker than usual.  Your sputum changes from clear or white to yellow, green, gray, or bloody.  Your medicines are causing side effects, such as a rash, itching, swelling, or trouble breathing.  You need to use a reliever medicine more than 2-3 times a week.  Your peak flow reading is still at 50-79% of your personal best after following your action plan for 1 hour.  You have a fever. Get help right away if:  You are getting worse and do not respond to treatment during an asthma attack.  You are short of breath when at rest  or when doing very little physical activity.  You have difficulty eating, drinking, or talking.  You have chest pain or tightness.  You develop a fast heartbeat or palpitations.  You have a bluish color to your lips or fingernails.  You are light-headed or dizzy, or you faint.  Your peak flow reading is less than 50% of your personal best.  You feel too tired to breathe normally. Summary  Asthma is a long-term (chronic) condition that causes recurrent episodes in which the airways become tight and narrow. These episodes can cause coughing, wheezing, shortness of breath, and chest pain.  Asthma cannot be cured, but medicines and lifestyle changes can help control it and treat acute attacks.  Make sure you understand how to avoid triggers and how and when to use your medicines.  Asthma attacks can range from minor to life threatening. Get help right away if you have an asthma attack and do not respond to treatment with your usual rescue medicines. This information is not intended to replace advice given to you by your health care provider. Make sure you discuss any questions you have with your health care provider. Document Released: 05/11/2005 Document Revised: 06/15/2016 Document Reviewed: 06/15/2016 Elsevier Interactive Patient Education  2019 [employer]   Sinusitis, Adult Sinusitis is inflammation of your sinuses. Sinuses are hollow spaces in the bones around your face. Your sinuses are located:  Around your eyes.  In the middle of your forehead.  Behind your nose.  In your cheekbones. Mucus normally drains out of your sinuses. When your nasal tissues become inflamed or swollen, mucus can become trapped or blocked. This allows bacteria, viruses, and fungi to grow, which leads to infection. Most infections of the sinuses are caused by a virus. Sinusitis can develop quickly. It can last for up to 4 weeks (acute) or for more than 12 weeks (chronic). Sinusitis often develops  after a cold. What are the causes? This condition is caused by anything that creates swelling in the sinuses or stops mucus from draining. This includes:  Allergies.  Asthma.  Infection from bacteria or viruses.  Deformities or blockages in your nose or sinuses.  Abnormal growths in the nose (nasal polyps).  Pollutants, such as chemicals or irritants in the air.  Infection from fungi (rare). What increases the risk? You are more likely to develop this condition if you:  Have a weak body defense system (immune system).  Do a lot of swimming or diving.  Overuse nasal sprays.  Smoke. What are the signs or symptoms? The main symptoms of this condition are pain and a feeling of pressure around the affected sinuses. Other symptoms include:  Stuffy nose or congestion.  Thick drainage from your nose.  Swelling and warmth over the affected sinuses.  Headache.  Upper toothache.  A cough that may get worse at night.  Extra mucus that collects in the throat or the back of the nose (postnasal drip).  Decreased sense of smell and taste.  Fatigue.  A fever.  Sore throat.  Bad breath. How is this diagnosed? This condition is diagnosed based on:  Your symptoms.  Your medical history.  A physical exam.  Tests to find out if your condition is acute or chronic. This may include: ? Checking your nose for nasal polyps. ? Viewing your sinuses using a device that has a light (endoscope). ? Testing for allergies or bacteria. ? Imaging tests, such as an MRI or CT scan. In rare cases, a bone biopsy may be done to rule out more serious types of fungal sinus disease. How is this treated? Treatment for sinusitis depends on the cause and whether your condition is chronic or acute.  If caused by a virus, your symptoms should go away on their own within 10 days. You may be given medicines to relieve symptoms. They include: ? Medicines that shrink swollen nasal passages (topical  intranasal decongestants). ? Medicines that treat allergies (antihistamines). ? A spray that eases inflammation of the nostrils (topical intranasal corticosteroids). ? Rinses that help get rid of thick mucus in your nose (nasal saline washes).  If caused by bacteria, your health care provider may recommend waiting to see if your symptoms improve. Most bacterial infections will get better without antibiotic medicine. You may be given antibiotics if you have: ? A severe infection. ? A weak immune system.  If caused by narrow nasal passages or nasal polyps, you may need to have surgery. Follow these instructions at home: Medicines  Take, use, or apply over-the-counter and prescription medicines only as told by your health care provider. These may include nasal sprays.  If you were prescribed an antibiotic medicine, take it as told by your health care provider. Do not stop taking the antibiotic even if you start to feel better. Hydrate and humidify   Drink enough fluid to keep your urine pale yellow. Staying hydrated will help to thin your mucus.  Use a cool mist humidifier to keep the humidity level in your home above 50%.  Inhale steam for 10-15 minutes, 3-4 times a day, or as told by your health care provider. You can do this in the bathroom while a hot shower is running.  Limit your exposure to cool or dry air. Rest  Rest as much as possible.  Sleep with your head raised (elevated).  Make sure you get enough sleep each night. General instructions   Apply a warm, moist washcloth to your face 3-4 times a day or as told by your health care provider. This will help with discomfort.  Wash your hands often with soap and water to reduce your exposure to germs. If soap and water are not available, use hand sanitizer.  Do not smoke. Avoid being around people who are smoking (secondhand smoke).  Keep all follow-up visits as told by your health care provider. This is important.  Contact a health care provider if:  You have a fever.  Your symptoms get worse.  Your symptoms do not improve within 10 days. Get help right away if:  You have a severe headache.  You have persistent vomiting.  You have severe pain or swelling around your face or eyes.  You have vision problems.  You develop confusion.  Your neck is stiff.  You have trouble breathing. Summary  Sinusitis is soreness and inflammation of your sinuses. Sinuses are hollow  spaces in the bones around your face.  This condition is caused by nasal tissues that become inflamed or swollen. The swelling traps or blocks the flow of mucus. This allows bacteria, viruses, and fungi to grow, which leads to infection.  If you were prescribed an antibiotic medicine, take it as told by your health care provider. Do not stop taking the antibiotic even if you start to feel better.  Keep all follow-up visits as told by your health care provider. This is important. This information is not intended to replace advice given to you by your health care provider. Make sure you discuss any questions you have with your health care provider. Document Released: 05/11/2005 Document Revised: 10/11/2017 Document Reviewed: 10/11/2017 Elsevier Interactive Patient Education  2019 [employer].

## 2018-11-04 NOTE — Assessment & Plan Note (Addendum)
-   Advised patient take Symbicort 2 puffs TWICE daily (refills sent) - RX prednisone 20mg  x 5 days  - Continue Zyrtec and Singulair as prescribed - Eos absolute 0.5 today  - If continues to have frequent exacerbations despite taking inhalers as prescribed may consider biologic

## 2018-11-06 NOTE — Progress Notes (Signed)
Chart and office note reviewed in detail  > agree with a/p as outlined    

## 2018-11-07 ENCOUNTER — Other Ambulatory Visit: Payer: Self-pay

## 2018-11-07 LAB — IGE: IgE (Immunoglobulin E), Serum: 88 kU/L (ref <=114)

## 2018-11-08 ENCOUNTER — Other Ambulatory Visit: Payer: Self-pay | Admitting: Women's Health

## 2018-11-08 ENCOUNTER — Ambulatory Visit (INDEPENDENT_AMBULATORY_CARE_PROVIDER_SITE_OTHER): Payer: 59

## 2018-11-08 DIAGNOSIS — Z780 Asymptomatic menopausal state: Secondary | ICD-10-CM

## 2018-11-08 DIAGNOSIS — M8589 Other specified disorders of bone density and structure, multiple sites: Secondary | ICD-10-CM | POA: Diagnosis not present

## 2018-11-08 DIAGNOSIS — Z13820 Encounter for screening for osteoporosis: Secondary | ICD-10-CM | POA: Diagnosis not present

## 2018-11-14 ENCOUNTER — Encounter: Payer: Self-pay | Admitting: Gynecology

## 2018-11-14 ENCOUNTER — Ambulatory Visit: Payer: 59 | Admitting: Internal Medicine

## 2018-12-16 ENCOUNTER — Ambulatory Visit: Payer: 59 | Admitting: Internal Medicine

## 2018-12-20 ENCOUNTER — Encounter: Payer: Self-pay | Admitting: Internal Medicine

## 2018-12-20 ENCOUNTER — Other Ambulatory Visit: Payer: Self-pay

## 2018-12-20 ENCOUNTER — Ambulatory Visit (INDEPENDENT_AMBULATORY_CARE_PROVIDER_SITE_OTHER): Payer: 59 | Admitting: Internal Medicine

## 2018-12-20 DIAGNOSIS — J45991 Cough variant asthma: Secondary | ICD-10-CM | POA: Diagnosis not present

## 2018-12-20 NOTE — Progress Notes (Signed)
Subjective:     Patient ID: Tara Carpenter, female   DOB: [birth year],    MRN: [id number]    Brief patient profile:  34 yowf never smoker onset of asthma 2nd pregancy 1st trimester manifested by cough better with prednisone / inhalers allergy w/u by Donneta Romberg pos trees/grasses  And ? Better p  Shots not clear but able to stop all meds with no flare though  some sinus issues = nasal polyposis  >>4  surgeries [facility]man/ lee/matthews last 2017  maintained on  singulair and rare need for albuterol but since Dec 2015 worse cough so referred by Dr Linna Darner to pulmonary clinic 08/07/2014      History of Present Illness  08/07/2014 1st  Pulmonary office visit/ Londen Bok   Chief Complaint  Patient presents with  . Pulmonary Consult    Referred by Dr. Unice Cobble. Pt states that she was dxed with asthma during her second pregnancy in 1995. She c/o wheezing and "hard to take in a deep breath" since Jan 2016.    in Dec 2015 started noticing need for albuterol several times per week helped a lot but symbicort did not work well  Then prednisone improved by 75% still not able to take full breath, occ cough  Dry day > noct mostly dry  Assoc with more sinus problems than usual  Back to exercising but tolerance not back to nl for her  Ok at hs  rec GERD diet  Work on Product manager inhaler technique:    If getting worse > add Try prilosec 20mg   Take 30-60 min before first meal of the day and Pepcid 20 mg one bedtime   Please remember to go to the  x-ray department downstairs for your tests - we will call you with the results when they are available. If not better see me  after a month, if 100% better ok to taper symbicort after a month      04/01/2017  Acute ext f/u ov/Tara Carpenter re: re establish re recurrent cough  Chief Complaint  Patient presents with  . Acute Visit    Pt c/o SOB, PND, and cough since end of Sept 2018. She states feels like there is something in her throat. She had some pred on hand and tried taking  this w/o much relief.  She is using her proair inhaler 2-3 x per day on average.   acute onset late Sept 2018 recurrent cough while maint on singulair   worse noct sev hours p hs and always  dry assoc with excessive throat cleairng  Prednisone may be 25% / symbicort did not help this time   No assoc nasal symptoms  Laurence Ferrari helps a little but Not limited by breathing from desired activities   rec Try prilosec otc 20mg   Take 30-60 min before first meal of the day and Pepcid ac (famotidine) 20 mg one @  bedtime until cough is completely gone for at least a week without the need for cough suppression GERD diet  Dulera 100 Take 2 puffs first thing in am and then another 2 puffs about 12 hours later - ok to take as needed after you try it out for a full 2 weeks Please remember to go to the lab department downstairs in the basement  for your tests - we will call you with the results when they are available. Follow up if not 100% satisfied     04/20/2017  f/u ov/Tara Carpenter re: cough since mid sept 2018  Chief Complaint  Patient presents with  . Follow-up    Cough has only improved slightly.  She is not producing any sputum. She was seen by ENT at [facility] and was dxed with sinus infection-currently on cephalexin.   took dulera x 2 weeks and d/c 04/17/17 ? maybe a little better  Day 6 abx per ent Still on pepcid 20 mg no longer on prilosec  No change  off vs back on fish oil  Totally sporadic harsh cough sev times a day / superimposed on throat clearing Cough day > noct  Laughing or cold air exp make her cough  rec Stay on the high dose acid suppression until the cough is 100% gone  GERD diet  Try symbiocort 160 Take 2 puffs first thing in am and then another 2 puffs about 12 hours later to see what difference it makes and if not convinced it's helping please stop  - repeat feno next ov if still coughing      06/02/2017  f/u ov/Tara Carpenter re: recurrent cough with this flare since Sept 2018 better with higher  dose symb  Chief Complaint  Patient presents with  . Follow-up    Pt states her cough continues to improve, but has not completely resolved. She uses her albuterol inhaler 3-4 x per wk on average.    still having noct spells once a week - wakes from sleep Using less albuterol now/ stopped gerd rx even though was not  100% as per instructions and Not using symb consistently - has not used 15 d sample up yet  Not limited by breathing from desired activities   Cold air most consistent trigger for cough  rec Prednisone 10 mg take  4 each am x 2 days,   2 each am x 2 days,  1 each am x 2 days and stop  Symbicort 160 Take 2 puffs first thing in am and then another 2 puffs about 12 hours later.  Only use your albuterol as a rescue medication to be used if you can't catch your breath  Stay on the high dose acid suppression until the cough is 100% gone  For at least a week and leave off the fish oil for now      06/30/2017  f/u ov/Tara Carpenter re: cough since sept 2018 better to her satisfaction  Chief Complaint  Patient presents with  . Follow-up    Barely coughing and her breathing has improved. She has not had to user her albuterol.   Dyspnea:  No problem with exercise  Cough: minimum Sleep: fine now rec Only use your albuterol as a rescue medication If coughing for any reason >  Try prilosec otc 20mg   Take 30-60 min before first meal of the day and Pepcid ac (famotidine) 20 mg one @  bedtime until cough is completely gone for at least a week without the need for cough suppression Continue sinus rinse daily Continue Zyrtec and singulair daily  Refill Symbicort - take two puffs TWICE daily Rx- prednisone 20mg  x 5 days If you develop sinus pain, color drainage or fever please call and let us know, will send in abx for sinusitis     12/20/2018  f/u ov/Tara Carpenter re: cough variant asthma /sinusitis with polyps > ENT arranging for dupixent Chief Complaint  Patient presents with  . Follow-up    Breathing  some worse- relates to humid weather and is using her albuterol inhaler once daily.   Dyspnea:  Nothing aerobic but Not limited by breathing  from desired activities   Cough: better Sleeping: 2-3 pillows  SABA use: use before walking alb 02: none     No obvious day to day or daytime variability or assoc excess/ purulent sputum or mucus plugs or hemoptysis or cp or chest tightness, subjective wheeze or overt sinus or hb symptoms.   sleeping without nocturnal  or early am exacerbation  of respiratory  c/o's or need for noct saba. Also denies any obvious fluctuation of symptoms with weather or environmental changes or other aggravating or alleviating factors except as outlined above   No unusual exposure hx or h/o childhood pna/ asthma or knowledge of premature birth.  Current Allergies, Complete Past Medical History, Past Surgical History, Family History, and Social History were reviewed in [employer] record.  ROS  The following are not active complaints unless bolded Hoarseness, sore throat, dysphagia, dental problems, itching, sneezing,  nasal congestion or discharge of excess mucus or purulent secretions, ear ache,   fever, chills, sweats, unintended wt loss or wt gain, classically pleuritic or exertional cp,  orthopnea pnd or arm/hand swelling  or leg swelling, presyncope, palpitations, abdominal pain, anorexia, nausea, vomiting, diarrhea  or change in bowel habits or change in bladder habits, change in stools or change in urine, dysuria, hematuria,  rash, arthralgias, visual complaints, headache, numbness, weakness or ataxia or problems with walking or coordination,  change in mood or  memory.        Current Meds  Medication Sig  . albuterol (PROAIR HFA) 108 (90 Base) MCG/ACT inhaler Inhale 1-2 puffs into the lungs every 6 (six) hours as needed for wheezing or shortness of breath.  . budesonide (PULMICORT) 0.25 MG/2ML nebulizer solution Take 0.25 mg by nebulization  daily. Nasal rinse  . budesonide-formoterol (SYMBICORT) 160-4.5 MCG/ACT inhaler Inhale 2 puffs into the lungs 2 (two) times daily.  . Calcium Carbonate-Vitamin D (CALCIUM + D PO) Take 1 tablet by mouth daily.   . Cetirizine HCl (ZYRTEC ALLERGY PO) Take 1 tablet by mouth daily.   . famotidine (PEPCID) 20 MG tablet Take 1 tablet (20 mg total) by mouth at bedtime. (Patient taking differently: Take 20 mg by mouth at bedtime as needed. )  . Montelukast Sodium (SINGULAIR PO) Take 1 tablet by mouth daily.   . Multiple Vitamin (MULTIVITAMIN) capsule Take 1 capsule by mouth daily.    Marland Kitchen omeprazole (PRILOSEC) 20 MG capsule Take 1 capsule (20 mg total) by mouth daily. (Patient taking differently: Take 20 mg by mouth daily as needed. )                      Objective:   Physical Exam     Amb wf nad   12/20/2018        131 06/30/2017          141 06/02/2017          140  04/20/2017      141  04/01/2017       140    08/07/14 139 lb (63.05 kg)  06/06/14 142 lb (64.411 kg)  02/16/14 136 lb (61.689 kg)    Vital signs reviewed - Note on arrival 02 sats  98% on RA     HEENT: nl dentition, turbinates bilaterally, and oropharynx. Nl external ear canals without cough reflex   NECK :  without JVD/Nodes/TM/ nl carotid upstrokes bilaterally   LUNGS: no acc muscle use,  Nl contour chest which is clear to A and P bilaterally  without cough on insp or exp maneuvers   CV:  RRR  no s3 or murmur or increase in P2, and no edema   ABD:  soft and nontender with nl inspiratory excursion in the supine position. No bruits or organomegaly appreciated, bowel sounds nl  MS:  Nl gait/ ext warm without deformities, calf tenderness, cyanosis or clubbing No obvious joint restrictions   SKIN: warm and dry without lesions    NEURO:  alert, approp, nl sensorium with  no motor or cerebellar deficits apparent.          Assessment:

## 2018-12-20 NOTE — Patient Instructions (Signed)
No change in medications  Agree with trial of dupixent per [facility] if insurance will pay   Please schedule a follow up visit in 6  months but call sooner if needed

## 2018-12-21 ENCOUNTER — Encounter: Payer: Self-pay | Admitting: Internal Medicine

## 2018-12-21 NOTE — Assessment & Plan Note (Addendum)
Onset during [employer] second semester 1995 FENO 04/01/2017  =   63  -  04/01/2017      90% > try dulera 100 2bid and gerd rx > min improvement  - Allergy profile 04/01/2017 >  Eos 0.4/  IgE 36  RAST neg - 04/20/2017   try symb 160 2bid > did not take consistently  - 06/02/2017  After extensive coaching inhaler device  effectiveness =  90 %   FENO 06/02/17 = 85 > rec take symb 160 2bid consistently = one more sample/ then fill rx and return in 6 weeks with all inhalers   - 12/20/2018 rec dupixent per ent [facility] for flares of sinusitis/ cough variant asthma  For now All goals of chronic asthma control met including optimal function and elimination of symptoms with minimal need for rescue therapy but requiring multiple meds/ freq prednisone to control rhinitis and asthma and likely recurrent nasal polyps so very good candidate for dupixent.   Contingencies discussed in full including contacting this office immediately if not controlling the symptoms using the rule of two's.     >>> f/u q 6 m with freq ent input in meantime

## 2019-01-13 MED ORDER — ALBUTEROL SULFATE HFA 108 (90 BASE) MCG/ACT IN AERS: 1.0000 | INHALATION_SPRAY | Freq: Four times a day (QID) | RESPIRATORY_TRACT | 5 refills | Status: DC | PRN

## 2019-02-21 ENCOUNTER — Encounter: Payer: Self-pay | Admitting: Gynecology

## 2019-03-14 NOTE — Progress Notes (Addendum)
Subjective:    Patient ID: Tara Carpenter, female    DOB: 26-Jun-1960, 58 y.o.   MRN: [id number]  HPI The patient is here for an acute visit for Cough x 3 weeks and lower back pain.   Nagging cough x 3 weeks.  She has had a nagging, dry cough for about 3 weeks.  She was wondering if it was related to heartburn, which has happened to her in the past.  She does not have the typical heartburn.  It can happen at night when she lays down and it is more common at night.  She did start to take the Pepcid and it may be slightly better, but she is still having it.  She will sometimes wake up coughing.  She also wondered if it was her asthma and she tried her inhaler, which may help a little-she is unsure.  She does clear her throat at times.  She denies any hoarseness.She denies difficulty swallowing or abdominal pain.   She has had some lower back pain for a little while.  She had similar pain in March, but it did go away.  The pain is across her lower back, but she has a focus of pain in the left lower side.  Nothing makes it worse.  Tylenol helps.  She cannot take ibuprofen because of her stomach.  She denies any radiation of the pain or numbness/tingling.  The heating pad helps a little.  She does do yoga on a regular basis and this is not new.  This does not make it worse.  Does not make it better.  Has been no obvious causes.  Medications and allergies reviewed with patient and updated if appropriate.  Patient Active Problem List   Diagnosis Date Noted  . Flu-like symptoms 05/26/2018  . Thyroid nodule - no FU needed 04/14/2018  . Gastroesophageal reflux disease 04/14/2018  . Nasal polyps 06/24/2016  . Arthralgia 06/24/2016  . Easy bruising 01/05/2015  . Dyspnea 08/07/2014  . Family hx of colon cancer 04/18/2012  . Osteopenia 02/10/2012  . Hyperlipidemia 05/29/2011  . Family history of ischemic heart disease 03/24/2011  . Sinus infection 12/01/2007  . Cough variant asthma 12/01/2007  .  DIABETES MELLITUS, GESTATIONAL, HX OF 12/01/2007    Current Outpatient Medications on File Prior to Visit  Medication Sig Dispense Refill  . albuterol (PROAIR HFA) 108 (90 Base) MCG/ACT inhaler Inhale 1-2 puffs into the lungs every 6 (six) hours as needed for wheezing or shortness of breath. 8 g 5  . budesonide (PULMICORT) 0.25 MG/2ML nebulizer solution Take 0.25 mg by nebulization daily. Nasal rinse    . budesonide-formoterol (SYMBICORT) 160-4.5 MCG/ACT inhaler Inhale 2 puffs into the lungs 2 (two) times daily. 1 Inhaler 11  . Calcium Carbonate-Vitamin D (CALCIUM + D PO) Take 1 tablet by mouth daily.     . Cetirizine HCl (ZYRTEC ALLERGY PO) Take 1 tablet by mouth daily.     . Dupilumab (DUPIXENT) 300 MG/2ML SOPN Inject into the skin.    . famotidine (PEPCID) 20 MG tablet Take 1 tablet (20 mg total) by mouth at bedtime. (Patient taking differently: Take 20 mg by mouth at bedtime as needed. ) 90 tablet 1  . Montelukast Sodium (SINGULAIR PO) Take 1 tablet by mouth daily.     . Multiple Vitamin (MULTIVITAMIN) capsule Take 1 capsule by mouth daily.       No current facility-administered medications on file prior to visit.     Past Medical  History:  Diagnosis Date  . Colon polyps 02/20/2010   Tubular adenoma, Hyperplastic  . Osteopenia 10/2018   FRAX 8.6% / 1.3%.  Stable from prior DEXA  . Rosacea     Past Surgical History:  Procedure Laterality Date  . NASAL SINUS SURGERY     x 3  . TEMPOROMANDIBULAR JOINT SURGERY      Social History   Socioeconomic History  . Marital status: Married    Spouse name: Not on file  . Number of children: Not on file  . Years of education: Not on file  . Highest education level: Not on file  Occupational History  . Not on file  Social Needs  . Financial resource strain: Not on file  . Food insecurity    Worry: Not on file    Inability: Not on file  . Transportation needs    Medical: Not on file    Non-medical: Not on file  Tobacco Use  .  Smoking status: Never Smoker  . Smokeless tobacco: Never Used  Substance and Sexual Activity  . Alcohol use: Yes    Alcohol/week: 6.0 standard drinks    Types: 6 Standard drinks or equivalent per week  . Drug use: No  . Sexual activity: Yes    Birth control/protection: Post-menopausal    Comment: Vasectomy-1st intercourse 58 yo-Fewer than 5 partners  Lifestyle  . Physical activity    Days per week: Not on file    Minutes per session: Not on file  . Stress: Not on file  Relationships  . Social Herbalist on phone: Not on file    Gets together: Not on file    Attends religious service: Not on file    Active member of club or organization: Not on file    Attends meetings of clubs or organizations: Not on file    Relationship status: Not on file  Other Topics Concern  . Not on file  Social History Narrative   Exercising regularly    Family History  Problem Relation Age of Onset  . Diabetes Mother   . Colon cancer Mother   . Diabetes Father   . Heart disease Father   . Hyperlipidemia Sister   . Hyperlipidemia Brother   . Hyperlipidemia Brother   . Hyperlipidemia Brother   . Hyperlipidemia Sister   . Hyperlipidemia Sister     Review of Systems  Constitutional: Negative for chills and fever.  HENT: Negative for trouble swallowing.   Respiratory: Positive for cough. Negative for shortness of breath and wheezing.   Gastrointestinal: Negative for abdominal pain.  Musculoskeletal: Positive for back pain. Negative for myalgias.  Neurological: Negative for weakness and numbness.       Objective:   Vitals:   03/15/19 1424  BP: 122/80  Pulse: 77  Resp: 16  Temp: 98.9 F (37.2 C)  SpO2: 98%   BP Readings from Last 3 Encounters:  03/15/19 122/80  12/20/18 126/74  11/04/18 104/60   Wt Readings from Last 3 Encounters:  03/15/19 133 lb (60.3 kg)  12/20/18 131 lb (59.4 kg)  11/04/18 130 lb 12.8 oz (59.3 kg)   Body mass index is 23.56 kg/m.   Physical  Exam    Constitutional: Appears well-developed and well-nourished. No distress.  HENT:  Head: Normocephalic and atraumatic.  Neck: Neck supple. No tracheal deviation present. No thyromegaly present.  No cervical lymphadenopathy Cardiovascular: Normal rate, regular rhythm and normal heart sounds.   No murmur heard.  No  edema Pulmonary/Chest: Effort normal and breath sounds normal. No respiratory distress. No has no wheezes. No rales.  Musculoskeletal: No tenderness or deformity of lumbar spine, minimal tenderness left lower back with palpation, possible mild left SI joint pain, no muscular spasms Neurological: Normal sensation, strength in lower extremities, gait normal Skin: Skin is warm and dry. Not diaphoretic.  Psychiatric: Normal mood and affect. Behavior is normal.       Assessment & Plan:    See Problem List for Assessment and Plan of chronic medical problems.

## 2019-03-15 ENCOUNTER — Ambulatory Visit (INDEPENDENT_AMBULATORY_CARE_PROVIDER_SITE_OTHER)
Admission: RE | Admit: 2019-03-15 | Discharge: 2019-03-15 | Disposition: A | Discharge status: Home / Self Care | Payer: 59 | Source: Ambulatory Visit | Attending: Internal Medicine | Admitting: Internal Medicine

## 2019-03-15 ENCOUNTER — Other Ambulatory Visit: Payer: Self-pay

## 2019-03-15 ENCOUNTER — Ambulatory Visit (INDEPENDENT_AMBULATORY_CARE_PROVIDER_SITE_OTHER): Payer: 59 | Admitting: Internal Medicine

## 2019-03-15 ENCOUNTER — Encounter: Payer: Self-pay | Admitting: Internal Medicine

## 2019-03-15 VITALS — BP 122/80 | HR 77 | Temp 98.9°F | Resp 16 | Ht 63.0 in | Wt 133.0 lb

## 2019-03-15 DIAGNOSIS — M545 Low back pain, unspecified: Secondary | ICD-10-CM | POA: Insufficient documentation

## 2019-03-15 DIAGNOSIS — Z23 Encounter for immunization: Secondary | ICD-10-CM

## 2019-03-15 DIAGNOSIS — K219 Gastro-esophageal reflux disease without esophagitis: Secondary | ICD-10-CM

## 2019-03-15 NOTE — Patient Instructions (Addendum)
For heartburn: Take omeprazole 20 mg daily for two weeks to see if that helps and then transition to the pepcid.      Have an xray of your lower back today. Try ice/heat, Voltaren gel.  If there is no improvement we can refer you to a sports medicine doctor.

## 2019-03-15 NOTE — Assessment & Plan Note (Addendum)
Lower back pain-across lower back without radiculopathy.  Also left SI joint pain Possibly arthritis, degenerative changes We will get x-rays Try Voltaren gel, ice, heat Continue yoga Consider sports medicine referral-she deferred today, but if there is no improvement she will let me know

## 2019-03-15 NOTE — Assessment & Plan Note (Signed)
Cough likely related to reflux, which has occurred to her for her in the past Advised her to take omeprazole 20 mg daily for 2 weeks and then switch over to Pepcid and slowly wean off the Pepcid-May need to take Pepcid on a chronic basis Call if no improvement

## 2019-03-16 ENCOUNTER — Encounter: Payer: Self-pay | Admitting: Internal Medicine

## 2019-03-18 ENCOUNTER — Encounter (INDEPENDENT_AMBULATORY_CARE_PROVIDER_SITE_OTHER): Payer: Self-pay

## 2019-05-08 ENCOUNTER — Encounter: Payer: Self-pay | Admitting: Internal Medicine

## 2019-05-08 ENCOUNTER — Other Ambulatory Visit: Payer: Self-pay | Admitting: *Deleted

## 2019-05-08 DIAGNOSIS — Z20822 Contact with and (suspected) exposure to covid-19: Secondary | ICD-10-CM

## 2019-05-08 DIAGNOSIS — Z20828 Contact with and (suspected) exposure to other viral communicable diseases: Secondary | ICD-10-CM

## 2019-05-11 ENCOUNTER — Ambulatory Visit: Payer: 59 | Attending: Internal Medicine

## 2019-05-11 DIAGNOSIS — Z20822 Contact with and (suspected) exposure to covid-19: Secondary | ICD-10-CM

## 2019-05-12 LAB — NOVEL CORONAVIRUS, NAA: SARS-CoV-2, NAA: NOT DETECTED

## 2019-06-23 ENCOUNTER — Ambulatory Visit: Payer: 59 | Admitting: Internal Medicine

## 2019-07-24 ENCOUNTER — Encounter: Payer: Self-pay | Admitting: Women's Health

## 2019-07-24 ENCOUNTER — Other Ambulatory Visit: Payer: Self-pay

## 2019-07-24 ENCOUNTER — Ambulatory Visit (INDEPENDENT_AMBULATORY_CARE_PROVIDER_SITE_OTHER): Payer: 59 | Admitting: Women's Health

## 2019-07-24 VITALS — BP 118/78 | Ht 63.0 in | Wt 131.0 lb

## 2019-07-24 DIAGNOSIS — R7309 Other abnormal glucose: Secondary | ICD-10-CM

## 2019-07-24 DIAGNOSIS — Z13220 Encounter for screening for lipoid disorders: Secondary | ICD-10-CM

## 2019-07-24 DIAGNOSIS — Z01419 Encounter for gynecological examination (general) (routine) without abnormal findings: Secondary | ICD-10-CM

## 2019-07-24 NOTE — Patient Instructions (Addendum)
Good to see you Vit D 2000  Carbohydrate Counting for Diabetes Mellitus, Adult  Carbohydrate counting is a method of keeping track of how many carbohydrates you eat. Eating carbohydrates naturally increases the amount of sugar (glucose) in the blood. Counting how many carbohydrates you eat helps keep your blood glucose within normal limits, which helps you manage your diabetes (diabetes mellitus). It is important to know how many carbohydrates you can safely have in each meal. This is different for every person. A diet and nutrition specialist (registered dietitian) can help you make a meal plan and calculate how many carbohydrates you should have at each meal and snack. Carbohydrates are found in the following foods:  Grains, such as breads and cereals.  Dried beans and soy products.  Starchy vegetables, such as potatoes, peas, and corn.  Fruit and fruit juices.  Milk and yogurt.  Sweets and snack foods, such as cake, cookies, candy, chips, and soft drinks. How do I count carbohydrates? There are two ways to count carbohydrates in food. You can use either of the methods or a combination of both. Reading "Nutrition Facts" on packaged food The "Nutrition Facts" list is included on the labels of almost all packaged foods and beverages in the [place] It includes:  The serving size.  Information about nutrients in each serving, including the grams (g) of carbohydrate per serving. To use the "Nutrition Facts":  Decide how many servings you will have.  Multiply the number of servings by the number of carbohydrates per serving.  The resulting number is the total amount of carbohydrates that you will be having. Learning standard serving sizes of other foods When you eat carbohydrate foods that are not packaged or do not include "Nutrition Facts" on the label, you need to measure the servings in order to count the amount of carbohydrates:  Measure the foods that you will eat with a food  scale or measuring cup, if needed.  Decide how many standard-size servings you will eat.  Multiply the number of servings by 15. Most carbohydrate-rich foods have about 15 g of carbohydrates per serving. ? For example, if you eat 8 oz (170 g) of strawberries, you will have eaten 2 servings and 30 g of carbohydrates (2 servings x 15 g = 30 g).  For foods that have more than one food mixed, such as soups and casseroles, you must count the carbohydrates in each food that is included. The following list contains standard serving sizes of common carbohydrate-rich foods. Each of these servings has about 15 g of carbohydrates:   hamburger bun or  English muffin.   oz (15 mL) syrup.   oz (14 g) jelly.  1 slice of bread.  1 six-inch tortilla.  3 oz (85 g) cooked rice or pasta.  4 oz (113 g) cooked dried beans.  4 oz (113 g) starchy vegetable, such as peas, corn, or potatoes.  4 oz (113 g) hot cereal.  4 oz (113 g) mashed potatoes or  of a large baked potato.  4 oz (113 g) canned or frozen fruit.  4 oz (120 mL) fruit juice.  4-6 crackers.  6 chicken nuggets.  6 oz (170 g) unsweetened dry cereal.  6 oz (170 g) plain fat-free yogurt or yogurt sweetened with artificial sweeteners.  8 oz (240 mL) milk.  8 oz (170 g) fresh fruit or one small piece of fruit.  24 oz (680 g) popped popcorn. Example of carbohydrate counting Sample meal  3 oz (85  g) chicken breast.  6 oz (170 g) brown rice.  4 oz (113 g) corn.  8 oz (240 mL) milk.  8 oz (170 g) strawberries with sugar-free whipped topping. Carbohydrate calculation 1. Identify the foods that contain carbohydrates: ? Rice. ? Corn. ? Milk. ? Strawberries. 2. Calculate how many servings you have of each food: ? 2 servings rice. ? 1 serving corn. ? 1 serving milk. ? 1 serving strawberries. 3. Multiply each number of servings by 15 g: ? 2 servings rice x 15 g = 30 g. ? 1 serving corn x 15 g = 15 g. ? 1 serving milk  x 15 g = 15 g. ? 1 serving strawberries x 15 g = 15 g. 4. Add together all of the amounts to find the total grams of carbohydrates eaten: ? 30 g + 15 g + 15 g + 15 g = 75 g of carbohydrates total. Summary  Carbohydrate counting is a method of keeping track of how many carbohydrates you eat.  Eating carbohydrates naturally increases the amount of sugar (glucose) in the blood.  Counting how many carbohydrates you eat helps keep your blood glucose within normal limits, which helps you manage your diabetes.  A diet and nutrition specialist (registered dietitian) can help you make a meal plan and calculate how many carbohydrates you should have at each meal and snack. This information is not intended to replace advice given to you by your health care provider. Make sure you discuss any questions you have with your health care provider. Document Revised: 12/03/2016 Document Reviewed: 10/23/2015 Elsevier Patient Education  2020 [employer] Maintenance for Postmenopausal Women Menopause is a normal process in which your ability to get pregnant comes to an end. This process happens slowly over many months or years, usually between the ages of 7 and 46. Menopause is complete when you have missed your menstrual periods for 12 months. It is important to talk with your health care provider about some of the most common conditions that affect women after menopause (postmenopausal women). These include heart disease, cancer, and bone loss (osteoporosis). Adopting a healthy lifestyle and getting preventive care can help to promote your health and wellness. The actions you take can also lower your chances of developing some of these common conditions. What should I know about menopause? During menopause, you may get a number of symptoms, such as:  Hot flashes. These can be moderate or severe.  Night sweats.  Decrease in sex drive.  Mood  swings.  Headaches.  Tiredness.  Irritability.  Memory problems.  Insomnia. Choosing to treat or not to treat these symptoms is a decision that you make with your health care provider. Do I need hormone replacement therapy?  Hormone replacement therapy is effective in treating symptoms that are caused by menopause, such as hot flashes and night sweats.  Hormone replacement carries certain risks, especially as you become older. If you are thinking about using estrogen or estrogen with progestin, discuss the benefits and risks with your health care provider. What is my risk for heart disease and stroke? The risk of heart disease, heart attack, and stroke increases as you age. One of the causes may be a change in the body's hormones during menopause. This can affect how your body uses dietary fats, triglycerides, and cholesterol. Heart attack and stroke are medical emergencies. There are many things that you can do to help prevent heart disease and stroke. Watch your blood pressure  High blood  pressure causes heart disease and increases the risk of stroke. This is more likely to develop in people who have high blood pressure readings, are of African descent, or are overweight.  Have your blood pressure checked: ? Every 3-5 years if you are 4-66 years of age. ? Every year if you are 75 years old or older. Eat a healthy diet   Eat a diet that includes plenty of vegetables, fruits, low-fat dairy products, and lean protein.  Do not eat a lot of foods that are high in solid fats, added sugars, or sodium. Get regular exercise Get regular exercise. This is one of the most important things you can do for your health. Most adults should:  Try to exercise for at least 150 minutes each week. The exercise should increase your heart rate and make you sweat (moderate-intensity exercise).  Try to do strengthening exercises at least twice each week. Do these in addition to the moderate-intensity  exercise.  Spend less time sitting. Even light physical activity can be beneficial. Other tips  Work with your health care provider to achieve or maintain a healthy weight.  Do not use any products that contain nicotine or tobacco, such as cigarettes, e-cigarettes, and chewing tobacco. If you need help quitting, ask your health care provider.  Know your numbers. Ask your health care provider to check your cholesterol and your blood sugar (glucose). Continue to have your blood tested as directed by your health care provider. Do I need screening for cancer? Depending on your health history and family history, you may need to have cancer screening at different stages of your life. This may include screening for:  Breast cancer.  Cervical cancer.  Lung cancer.  Colorectal cancer. What is my risk for osteoporosis? After menopause, you may be at increased risk for osteoporosis. Osteoporosis is a condition in which bone destruction happens more quickly than new bone creation. To help prevent osteoporosis or the bone fractures that can happen because of osteoporosis, you may take the following actions:  If you are 47-66 years old, get at least 1,000 mg of calcium and at least 600 mg of vitamin D per day.  If you are older than age 54 but younger than age 31, get at least 1,200 mg of calcium and at least 600 mg of vitamin D per day.  If you are older than age 10, get at least 1,200 mg of calcium and at least 800 mg of vitamin D per day. Smoking and drinking excessive alcohol increase the risk of osteoporosis. Eat foods that are rich in calcium and vitamin D, and do weight-bearing exercises several times each week as directed by your health care provider. How does menopause affect my mental health? Depression may occur at any age, but it is more common as you become older. Common symptoms of depression include:  Low or sad mood.  Changes in sleep patterns.  Changes in appetite or eating  patterns.  Feeling an overall lack of motivation or enjoyment of activities that you previously enjoyed.  Frequent crying spells. Talk with your health care provider if you think that you are experiencing depression. General instructions See your health care provider for regular wellness exams and vaccines. This may include:  Scheduling regular health, dental, and eye exams.  Getting and maintaining your vaccines. These include: ? Influenza vaccine. Get this vaccine each year before the flu season begins. ? Pneumonia vaccine. ? Shingles vaccine. ? Tetanus, diphtheria, and pertussis (Tdap) booster vaccine. Your health  care provider may also recommend other immunizations. Tell your health care provider if you have ever been abused or do not feel safe at home. Summary  Menopause is a normal process in which your ability to get pregnant comes to an end.  This condition causes hot flashes, night sweats, decreased interest in sex, mood swings, headaches, or lack of sleep.  Treatment for this condition may include hormone replacement therapy.  Take actions to keep yourself healthy, including exercising regularly, eating a healthy diet, watching your weight, and checking your blood pressure and blood sugar levels.  Get screened for cancer and depression. Make sure that you are up to date with all your vaccines. This information is not intended to replace advice given to you by your health care provider. Make sure you discuss any questions you have with your health care provider. Document Revised: 05/04/2018 Document Reviewed: 05/04/2018 Elsevier Patient Education  2020 [employer] ready for charts to be

## 2019-07-24 NOTE — Progress Notes (Signed)
Tara Carpenter 1961-04-09 [id number]    History:    Presents for annual exam.  Postmenopausal on no HRT with no bleeding.  History of an abnormal Pap greater than 25 years ago normal Paps since.  Normal mammogram history.  2015-08-27 - colonoscopy 5-year follow-up mother history of colon cancer deceased.  11-27-18 T score -1.5 at spine, -2.2 at femoral neck FRAX 8.6% / 1.3% stable.  04/2019 nasal polyp surgery.  Last hemoglobin A1c 5.9.  Gestational diabetes with 1 of 3 pregnancies.  Past medical history, past surgical history, family history and social history were all reviewed and documented in the EPIC chart.  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  Both parents diabetes.  ROS:  A ROS was performed and pertinent positives and negatives are included.  Exam:  Vitals:   07/24/19 0909  BP: 118/78  Weight: 131 lb (59.4 kg)  Height: 5\' 3"  (1.6 m)   Body mass index is 23.21 kg/m.   General appearance:  Normal Thyroid:  Symmetrical, normal in size, without palpable masses or nodularity. Respiratory  Auscultation:  Clear without wheezing or rhonchi Cardiovascular  Auscultation:  Regular rate, without rubs, murmurs or gallops  Edema/varicosities:  Not grossly evident Abdominal  Soft,nontender, without masses, guarding or rebound.  Liver/spleen:  No organomegaly noted  Hernia:  None appreciated  Skin  Inspection:  Grossly normal   Breasts: Examined lying and sitting.     Right: Without masses, retractions, discharge or axillary adenopathy.     Left: Without masses, retractions, discharge or axillary adenopathy. Gentitourinary   Inguinal/mons:  Normal without inguinal adenopathy  External genitalia:  Normal  BUS/Urethra/Skene's glands:  Normal  Vagina:  Normal  Cervix:  Normal  Uterus:  normal in size, shape and contour.  Midline and mobile  Adnexa/parametria:     Rt: Without masses or tenderness.   Lt: Without masses or  tenderness.  Anus and perineum: Normal  Digital rectal exam: Normal sphincter tone without palpated masses or tenderness  Assessment/Plan:  59 y.o. MWF G4, P3 for annual exam with no complaints of vaginal discharge, dyspareunia, urinary symptoms, or abdominal pain.  Postmenopausal/no HRT/no bleeding Asthma-pulmonologist manages GERD Osteopenia without elevated FRAX  Plan: SBEs, continue annual 3D screening mammogram, calcium rich foods, vitamin D 2000 IUs daily encouraged.  Aware of importance of weightbearing and balance type exercise, home safety and fall prevention discussed.  Repeat colonoscopy 08/26/20, mother deceased colon cancer.  Aware hemoglobin A1c prediabetes continue to decrease simple sugars, increase exercise.  Lost 10 pounds last year and has maintained.  Pap normal with negative HR HPV 2020, new screening guidelines reviewed.  CBC, lipid panel, CMP, hemoglobin A1c.    Huel Cote [facility], 11:04 AM 07/24/2019

## 2019-07-25 LAB — CBC WITH DIFFERENTIAL/PLATELET
Absolute Monocytes: 304 cells/uL (ref 200–950)
Basophils Absolute: 51 cells/uL (ref 0–200)
Basophils Relative: 1.1 %
Eosinophils Absolute: 271 cells/uL (ref 15–500)
Eosinophils Relative: 5.9 %
HCT: 39.6 % (ref 35.0–45.0)
Hemoglobin: 13.5 g/dL (ref 11.7–15.5)
Lymphs Abs: 1366 cells/uL (ref 850–3900)
MCH: 32.8 pg (ref 27.0–33.0)
MCHC: 34.1 g/dL (ref 32.0–36.0)
MCV: 96.4 fL (ref 80.0–100.0)
MPV: 10.7 fL (ref 7.5–12.5)
Monocytes Relative: 6.6 %
Neutro Abs: 2608 cells/uL (ref 1500–7800)
Neutrophils Relative %: 56.7 %
Platelets: 263 10*3/uL (ref 140–400)
RBC: 4.11 10*6/uL (ref 3.80–5.10)
RDW: 12.1 % (ref 11.0–15.0)
Total Lymphocyte: 29.7 %
WBC: 4.6 10*3/uL (ref 3.8–10.8)

## 2019-07-25 LAB — COMPREHENSIVE METABOLIC PANEL
AG Ratio: 2 (calc) (ref 1.0–2.5)
ALT: 15 U/L (ref 6–29)
AST: 20 U/L (ref 10–35)
Albumin: 4.5 g/dL (ref 3.6–5.1)
Alkaline phosphatase (APISO): 58 U/L (ref 37–153)
BUN: 13 mg/dL (ref 7–25)
CO2: 27 mmol/L (ref 20–32)
Calcium: 9.6 mg/dL (ref 8.6–10.4)
Chloride: 105 mmol/L (ref 98–110)
Creat: 0.84 mg/dL (ref 0.50–1.05)
Globulin: 2.2 g/dL (calc) (ref 1.9–3.7)
Glucose, Bld: 98 mg/dL (ref 65–99)
Potassium: 4.3 mmol/L (ref 3.5–5.3)
Sodium: 140 mmol/L (ref 135–146)
Total Bilirubin: 0.6 mg/dL (ref 0.2–1.2)
Total Protein: 6.7 g/dL (ref 6.1–8.1)

## 2019-07-25 LAB — HEMOGLOBIN A1C
Hgb A1c MFr Bld: 5.6 % of total Hgb (ref <5.7)
Mean Plasma Glucose: 114 (calc)
eAG (mmol/L): 6.3 (calc)

## 2019-07-25 LAB — URINALYSIS, COMPLETE W/RFL CULTURE
Bacteria, UA: NONE SEEN /HPF
Bilirubin Urine: NEGATIVE
Glucose, UA: NEGATIVE
Hgb urine dipstick: NEGATIVE
Hyaline Cast: NONE SEEN /LPF
Ketones, ur: NEGATIVE
Leukocyte Esterase: NEGATIVE
Nitrites, Initial: NEGATIVE
Protein, ur: NEGATIVE
RBC / HPF: NONE SEEN /HPF (ref 0–2)
Specific Gravity, Urine: 1.004 (ref 1.001–1.03)
Squamous Epithelial / HPF: NONE SEEN /HPF (ref <=5)
WBC, UA: NONE SEEN /HPF (ref 0–5)
pH: 8 (ref 5.0–8.0)

## 2019-07-25 LAB — LIPID PANEL
Cholesterol: 278 mg/dL — ABNORMAL HIGH (ref <200)
HDL: 75 mg/dL (ref >=50)
LDL Cholesterol (Calc): 177 mg/dL (calc) — ABNORMAL HIGH
Non-HDL Cholesterol (Calc): 203 mg/dL (calc) — ABNORMAL HIGH (ref <130)
Total CHOL/HDL Ratio: 3.7 (calc) (ref <5.0)
Triglycerides: 123 mg/dL (ref <150)

## 2019-07-25 LAB — NO CULTURE INDICATED

## 2019-08-21 ENCOUNTER — Ambulatory Visit: Payer: 59 | Attending: Internal Medicine

## 2019-08-21 DIAGNOSIS — Z20822 Contact with and (suspected) exposure to covid-19: Secondary | ICD-10-CM

## 2019-08-22 LAB — SARS-COV-2, NAA 2 DAY TAT

## 2019-08-22 LAB — NOVEL CORONAVIRUS, NAA: SARS-CoV-2, NAA: NOT DETECTED

## 2019-10-18 ENCOUNTER — Encounter: Payer: Self-pay | Admitting: Women's Health

## 2020-01-02 ENCOUNTER — Encounter: Payer: Self-pay | Admitting: Internal Medicine

## 2020-01-02 ENCOUNTER — Telehealth (INDEPENDENT_AMBULATORY_CARE_PROVIDER_SITE_OTHER): Payer: 59 | Admitting: Internal Medicine

## 2020-01-02 DIAGNOSIS — J45991 Cough variant asthma: Secondary | ICD-10-CM | POA: Diagnosis not present

## 2020-01-02 MED ORDER — DOXYCYCLINE HYCLATE 100 MG PO TABS: 100.0000 mg | ORAL_TABLET | Freq: Two times a day (BID) | ORAL | 0 refills | Status: DC

## 2020-01-02 NOTE — Assessment & Plan Note (Signed)
Suspect flare, prednisone did not improve. Rx doxycycline to clear any infection. Does not sound consistent with covid-19 and previously vaccinated to same.

## 2020-01-02 NOTE — Progress Notes (Signed)
Virtual Visit via Video Note  I connected with Tara Carpenter on 01/02/20 at  2:20 PM EDT by a video enabled telemedicine application and verified that I am speaking with the correct person using two identifiers.  The patient and the provider were at separate locations throughout the entire encounter. Patient location: home, Provider location: work   I discussed the limitations of evaluation and management by telemedicine and the availability of in person appointments. The patient expressed understanding and agreed to proceed. The patient and the provider were the only parties present for the visit unless noted in HPI below.  History of Present Illness: The patient is a 59 y.o. female with visit for cough going on for 3 weeks. Thought it was asthma/sinuses and went to ENT. Started prednisone for that which did not help at all and took symbicort regularly for several days. She did not notice benefit. More congestion in the throat and cough mostly non-productive. Has no fevers or chills. Overall stable but not improving. Denies muscle aches. Has been previously vaccinated for covid-19.  Observations/Objective: Appearance: normal, breathing appears normal, no coughing or dyspnea during visit, casual grooming, abdomen does not appear distended, throat normal, memory normal, mental status is A and O times 3  Assessment and Plan: See problem oriented charting  Follow Up Instructions: rx doxycycline, continue zyrtec and singulair  I discussed the assessment and treatment plan with the patient. The patient was provided an opportunity to ask questions and all were answered. The patient agreed with the plan and demonstrated an understanding of the instructions.   The patient was advised to call back or seek an in-person evaluation if the symptoms worsen or if the condition fails to improve as anticipated.  Hoyt Koch, MD

## 2020-01-16 NOTE — Progress Notes (Signed)
Virtual Visit via Video Note  I connected with Tara Carpenter on 01/17/20 at  8:15 AM EDT by a video enabled telemedicine application and verified that I am speaking with the correct person using two identifiers.   I discussed the limitations of evaluation and management by telemedicine and the availability of in person appointments. The patient expressed understanding and agreed to proceed.  Present for the visit:  Myself, Dr Billey Gosling, Theressa Millard.  The patient is currently at home and I am in the office.    No referring provider.    History of Present Illness: This is an acute visit for cough.  Saw ENT 7/28 - was coughing/wheezing - rx'd prednisone. She does not feel this helped much.   Was seen virtually here 8/10 - thought to have flare of cough variant asthma.  Was prescribed doxycyline 8/10.  She did not see much change after this.   She continues to cough.  She has a dry cough that can cause episodes of coughing.  Her throat is dry which feels like it triggers these episodes.  She coughs up phlegm, mostly in the morning which is yellow-clear - sometimes later in the day.    She does daily sinus washes and will get some green mucus.  She denies sinus pain or headaches.    Her cough started about 7/19.    Inhaler causes cough down.  Does sinus wash. She is taking pepcid.    She has a sensation of pressure or tightness in her throat.    Review of Systems  Constitutional: Negative for chills and fever.  HENT: Negative for congestion, ear pain, sinus pain and sore throat.        Constant clearing  Respiratory: Positive for cough and sputum production (in morning mostly). Negative for wheezing.   Neurological: Negative for headaches.      Social History   Socioeconomic History  . Marital status: Married    Spouse name: Not on file  . Number of children: Not on file  . Years of education: Not on file  . Highest education level: Not on file  Occupational History  .  Not on file  Tobacco Use  . Smoking status: Never Smoker  . Smokeless tobacco: Never Used  Vaping Use  . Vaping Use: Never used  Substance and Sexual Activity  . Alcohol use: Yes    Alcohol/week: 6.0 standard drinks    Types: 6 Standard drinks or equivalent per week  . Drug use: No  . Sexual activity: Yes    Birth control/protection: Post-menopausal    Comment: Vasectomy-1st intercourse 59 yo-Fewer than 5 partners  Other Topics Concern  . Not on file  Social History Narrative   Exercising regularly   Social Determinants of Health   Financial Resource Strain:   . Difficulty of Paying Living Expenses: Not on file  Food Insecurity:   . Worried About Charity fundraiser in the Last Year: Not on file  . Ran Out of Food in the Last Year: Not on file  Transportation Needs:   . Lack of Transportation (Medical): Not on file  . Lack of Transportation (Non-Medical): Not on file  Physical Activity:   . Days of Exercise per Week: Not on file  . Minutes of Exercise per Session: Not on file  Stress:   . Feeling of Stress : Not on file  Social Connections:   . Frequency of Communication with Friends and Family: Not on file  .  Frequency of Social Gatherings with Friends and Family: Not on file  . Attends Religious Services: Not on file  . Active Member of Clubs or Organizations: Not on file  . Attends Archivist Meetings: Not on file  . Marital Status: Not on file     Observations/Objective: Appears well in NAD Breathing normally Skin appears warm and dry  Assessment and Plan:  See Problem List for Assessment and Plan of chronic medical problems.   Follow Up Instructions:    I discussed the assessment and treatment plan with the patient. The patient was provided an opportunity to ask questions and all were answered. The patient agreed with the plan and demonstrated an understanding of the instructions.   The patient was advised to call back or seek an in-person  evaluation if the symptoms worsen or if the condition fails to improve as anticipated.    Binnie Rail, MD

## 2020-01-17 ENCOUNTER — Telehealth (INDEPENDENT_AMBULATORY_CARE_PROVIDER_SITE_OTHER): Payer: 59 | Admitting: Internal Medicine

## 2020-01-17 ENCOUNTER — Encounter: Payer: Self-pay | Admitting: Internal Medicine

## 2020-01-17 DIAGNOSIS — J329 Chronic sinusitis, unspecified: Secondary | ICD-10-CM | POA: Diagnosis not present

## 2020-01-17 DIAGNOSIS — J45991 Cough variant asthma: Secondary | ICD-10-CM

## 2020-01-17 MED ORDER — AZITHROMYCIN 250 MG PO TABS: ORAL_TABLET | ORAL | 0 refills | Status: DC

## 2020-01-17 NOTE — Assessment & Plan Note (Signed)
Still with cough that is likely related to sinus drainage and asthma Start symbicort BID zpak continue other allergy/asthma meds, sinus washes, pepcid

## 2020-01-17 NOTE — Assessment & Plan Note (Signed)
Subacute Symptoms concerning for sinus infection Will try a zpak Continue sinus washes, other meds

## 2020-01-19 ENCOUNTER — Other Ambulatory Visit: Payer: Self-pay | Admitting: Primary Care

## 2020-01-19 ENCOUNTER — Other Ambulatory Visit: Payer: Self-pay | Admitting: Internal Medicine

## 2020-08-14 LAB — HM COLONOSCOPY

## 2020-08-20 ENCOUNTER — Ambulatory Visit (INDEPENDENT_AMBULATORY_CARE_PROVIDER_SITE_OTHER): Payer: 59 | Admitting: Nurse Practitioner

## 2020-08-20 ENCOUNTER — Encounter: Payer: Self-pay | Admitting: Nurse Practitioner

## 2020-08-20 ENCOUNTER — Other Ambulatory Visit: Payer: Self-pay

## 2020-08-20 VITALS — BP 118/76 | Ht 62.0 in | Wt 133.0 lb

## 2020-08-20 DIAGNOSIS — Z780 Asymptomatic menopausal state: Secondary | ICD-10-CM | POA: Diagnosis not present

## 2020-08-20 DIAGNOSIS — Z01419 Encounter for gynecological examination (general) (routine) without abnormal findings: Secondary | ICD-10-CM | POA: Diagnosis not present

## 2020-08-20 DIAGNOSIS — Z87898 Personal history of other specified conditions: Secondary | ICD-10-CM

## 2020-08-20 DIAGNOSIS — M85851 Other specified disorders of bone density and structure, right thigh: Secondary | ICD-10-CM

## 2020-08-20 DIAGNOSIS — M85852 Other specified disorders of bone density and structure, left thigh: Secondary | ICD-10-CM

## 2020-08-20 NOTE — Patient Instructions (Signed)

## 2020-08-20 NOTE — Progress Notes (Signed)
   Tara Carpenter [birth year] [id number]   History:  60 y.o. [id number] presents for annual exam without GYN complaints. Postmenopausal - no HRT, no bleeding. Abnormal pap > 25 years ago, subsequent paps normal. Normal mammogram history. Osteopenia. Mother deceased from colon cancer.   Gynecologic History Patient's last menstrual period was 07/15/2011.   Contraception/Family planning: post menopausal status  Health Maintenance Last Pap: 07/19/2018. Results were: normal Last mammogram: 09/2019. Results were: normal Last colonoscopy: 07/2020. Results were: benign polyps x 5, 1 year recall recommended Last Dexa: 10/2018. Results were: t-score -2.2, FRAX 8.6% / 1.3%  Past medical history, past surgical history, family history and social history were all reviewed and documented in the EPIC chart. Travel Agent. Married. 2 sons (one in [place], one in [place]), 1 daughter (in [place] - new job in Pharmacologist). Father had diabetes, deceased from heart disease in his 60s. Mother was diabetic, deceased from colon cancer.   ROS:  A ROS was performed and pertinent positives and negatives are included.  Exam:  Vitals:   08/20/20 0833  BP: 118/76  Weight: 133 lb (60.3 kg)  Height: 5\' 2"  (1.575 m)   Body mass index is 24.33 kg/m.  General appearance:  Normal Thyroid:  Symmetrical, normal in size, without palpable masses or nodularity. Respiratory  Auscultation:  Clear without wheezing or rhonchi Cardiovascular  Auscultation:  Regular rate, without rubs, murmurs or gallops  Edema/varicosities:  Not grossly evident Abdominal  Soft,nontender, without masses, guarding or rebound.  Liver/spleen:  No organomegaly noted  Hernia:  None appreciated  Skin  Inspection:  Grossly normal   Breasts: Examined lying and sitting.   Right: Without masses, retractions, discharge or axillary adenopathy.   Left: Without masses, retractions, discharge or axillary adenopathy. Gentitourinary   Inguinal/mons:  Normal  without inguinal adenopathy  External genitalia:  Normal  BUS/Urethra/Skene's glands:  Normal  Vagina:  Atrophic changes  Cervix:  Normal  Uterus:  Normal in size, shape and contour.  Midline and mobile  Adnexa/parametria:     Rt: Without masses or tenderness.   Lt: Without masses or tenderness.  Anus and perineum: Normal  Digital rectal exam: Declined  Assessment/Plan:  60 y.o. [id number] for annual exam.   Well female exam with routine gynecological exam - Plan: CBC with Differential/Platelet, Comprehensive metabolic panel, Lipid panel, TSH. Education provided on SBEs, importance of preventative screenings, current guidelines, high calcium diet, regular exercise, and multivitamin daily.   Osteopenia of necks of both femurs - Plan: DG Bone Density. Recommend daily Vitamin D supplement. She exercises regularly. She will scheduled DEXA after June.   History of prediabetes - Plan: Hemoglobin A1c. Both parents were diabetic. Patient has been prediabetic in the past.   Postmenopausal - Plan: DG Bone Density. No HRT, no bleeding.   Screening for cervical cancer - Abnormal pap > 25 years ago, subsequent paps normal. Will repeat at 5-year interval per guidelines.  Screening for breast cancer - Normal mammogram history.  Continue annual screenings.  Normal breast exam today.  Screening for colon cancer - 07/2020 colonoscopy - benign polyps x 5. 1-year recall recommended. Mother deceased from colon cancer.   Return in 1 year for annual.    Tamela Gammon DNP, 8:57 AM 08/20/2020

## 2020-08-21 LAB — COMPREHENSIVE METABOLIC PANEL
AG Ratio: 2 (calc) (ref 1.0–2.5)
ALT: 10 U/L (ref 6–29)
AST: 15 U/L (ref 10–35)
Albumin: 4.6 g/dL (ref 3.6–5.1)
Alkaline phosphatase (APISO): 54 U/L (ref 37–153)
BUN: 12 mg/dL (ref 7–25)
CO2: 26 mmol/L (ref 20–32)
Calcium: 9.5 mg/dL (ref 8.6–10.4)
Chloride: 105 mmol/L (ref 98–110)
Creat: 0.86 mg/dL (ref 0.50–1.05)
Globulin: 2.3 g/dL (calc) (ref 1.9–3.7)
Glucose, Bld: 94 mg/dL (ref 65–99)
Potassium: 4.5 mmol/L (ref 3.5–5.3)
Sodium: 140 mmol/L (ref 135–146)
Total Bilirubin: 0.6 mg/dL (ref 0.2–1.2)
Total Protein: 6.9 g/dL (ref 6.1–8.1)

## 2020-08-21 LAB — CBC WITH DIFFERENTIAL/PLATELET
Absolute Monocytes: 277 cells/uL (ref 200–950)
Basophils Absolute: 31 cells/uL (ref 0–200)
Basophils Relative: 0.8 %
Eosinophils Absolute: 59 cells/uL (ref 15–500)
Eosinophils Relative: 1.5 %
HCT: 39.6 % (ref 35.0–45.0)
Hemoglobin: 13.3 g/dL (ref 11.7–15.5)
Lymphs Abs: 963 cells/uL (ref 850–3900)
MCH: 32.3 pg (ref 27.0–33.0)
MCHC: 33.6 g/dL (ref 32.0–36.0)
MCV: 96.1 fL (ref 80.0–100.0)
MPV: 10.3 fL (ref 7.5–12.5)
Monocytes Relative: 7.1 %
Neutro Abs: 2570 cells/uL (ref 1500–7800)
Neutrophils Relative %: 65.9 %
Platelets: 256 10*3/uL (ref 140–400)
RBC: 4.12 10*6/uL (ref 3.80–5.10)
RDW: 11.9 % (ref 11.0–15.0)
Total Lymphocyte: 24.7 %
WBC: 3.9 10*3/uL (ref 3.8–10.8)

## 2020-08-21 LAB — LIPID PANEL
Cholesterol: 286 mg/dL — ABNORMAL HIGH (ref <200)
HDL: 75 mg/dL (ref >=50)
LDL Cholesterol (Calc): 186 mg/dL (calc) — ABNORMAL HIGH
Non-HDL Cholesterol (Calc): 211 mg/dL (calc) — ABNORMAL HIGH (ref <130)
Total CHOL/HDL Ratio: 3.8 (calc) (ref <5.0)
Triglycerides: 121 mg/dL (ref <150)

## 2020-08-21 LAB — HEMOGLOBIN A1C
Hgb A1c MFr Bld: 5.6 % of total Hgb (ref <5.7)
Mean Plasma Glucose: 114 mg/dL
eAG (mmol/L): 6.3 mmol/L

## 2020-08-21 LAB — TSH: TSH: 1.52 mIU/L (ref 0.40–4.50)

## 2020-08-29 ENCOUNTER — Encounter: Payer: Self-pay | Admitting: Internal Medicine

## 2020-08-29 NOTE — Progress Notes (Signed)
Outside notes received. Information abstracted. Notes sent to scan.  

## 2020-09-17 ENCOUNTER — Encounter: Payer: Self-pay | Admitting: Nurse Practitioner

## 2020-10-18 ENCOUNTER — Encounter: Payer: Self-pay | Admitting: Nurse Practitioner

## 2020-12-15 ENCOUNTER — Encounter: Payer: Self-pay | Admitting: Internal Medicine

## 2021-08-25 NOTE — Progress Notes (Signed)
? ?Tara Carpenter Jul 19, 1960 [id number] ? ? ?History:  61 y.o. [id number] presents for annual exam without GYN complaints. Postmenopausal - no HRT, no bleeding. Abnormal pap > 25 years ago, subsequent paps normal. Normal mammogram history. ? ?Gynecologic History ?Patient's last menstrual period was 07/15/2011. ?  ?Contraception/Family planning: post menopausal status ?Sexually active: Yes ? ?Health Maintenance ?Last Pap: 07/19/2018. Results were: Normal, 5-year repeat ?Last mammogram: 5/7//2022. Results were: Normal ?Last colonoscopy: 08/14/2020. Results were: Benign polyps x 5, 1 year recall recommended ?Last Dexa: 11/08/2018. Results were: T-score -2.2, FRAX 8.6% / 1.3% ? ?Past medical history, past surgical history, family history and social history were all reviewed and documented in the EPIC chart. Travel Agent. Married. 2 sons (one in [place], one in [place]), 1 daughter (in [place] - recently left marketing job d/t difficult boss). Father had diabetes, deceased from heart disease in his 13s. Mother was diabetic, deceased from colon cancer.  ? ?ROS:  A ROS was performed and pertinent positives and negatives are included. ? ?Exam: ? ?Vitals:  ? 08/26/21 0830  ?BP: 122/76  ?Weight: 130 lb (59 kg)  ?Height: '5\' 2"'$  (1.575 m)  ? ? ?Body mass index is 23.78 kg/m?. ? ?General appearance:  Normal ?Thyroid:  Symmetrical, normal in size, without palpable masses or nodularity. ?Respiratory ? Auscultation:  Clear without wheezing or rhonchi ?Cardiovascular ? Auscultation:  Regular rate, without rubs, murmurs or gallops ? Edema/varicosities:  Not grossly evident ?Abdominal ? Soft,nontender, without masses, guarding or rebound. ? Liver/spleen:  No organomegaly noted ? Hernia:  None appreciated ? Skin ? Inspection:  Grossly normal ?  ?Breasts: Examined lying and sitting.  ? Right: Without masses, retractions, discharge or axillary adenopathy. ? ? Left: Without masses, retractions, discharge or axillary adenopathy. ?Genitourinary   ? Inguinal/mons:  Normal without inguinal adenopathy ? External genitalia:  Normal appearing vulva with no masses, tenderness, or lesions ? BUS/Urethra/Skene's glands:  Normal ? Vagina:  Normal appearing with normal color and discharge, no lesions. Atrophic changes ? Cervix:  Normal appearing without discharge or lesions ? Uterus:  Normal in size, shape and contour.  Midline and mobile, nontender ? Adnexa/parametria:   ?  Rt: Normal in size, without masses or tenderness. ?  Lt: Normal in size, without masses or tenderness. ? Anus and perineum: Normal ? Digital rectal exam: Normal sphincter tone without palpated masses or tenderness ? ?Patient informed chaperone available to be present for breast and pelvic exam. Patient has requested no chaperone to be present. Patient has been advised what will be completed during breast and pelvic exam.  ? ?Assessment/Plan:  61 y.o. [id number] for annual exam.  ? ?Well female exam with routine gynecological exam - Plan: CBC with Differential/Platelet, Comprehensive metabolic panel. Education provided on SBEs, importance of preventative screenings, current guidelines, high calcium diet, regular exercise, and multivitamin daily.  ? ?Postmenopausal - Plan: DG Bone Density. No HRT, no bleeding.  ? ?Osteopenia of necks of both femurs - Plan: DG Bone Density. T-score -2.2 in 2020. Recommend Vitamin D supplement, high calcium diet, and exercise. We will repeat DXA now.  ? ?Family history of diabetes mellitus - Plan: Hemoglobin A1c ? ?Hyperlipidemia, unspecified hyperlipidemia type - Plan: Lipid panel. Was on Statin the past but stopped used due to joint pain.  ? ?History of prediabetes - Plan: Hemoglobin A1c. Both parents were diabetic. Patient has been prediabetic in the past.  ? ?Screening for cervical cancer - Abnormal pap > 25 years ago, subsequent paps normal. Will repeat  at 5-year interval per guidelines. ? ?Screening for breast cancer - Normal mammogram history.  Continue annual  screenings.  Normal breast exam today. ? ?Screening for colon cancer - 07/2020 colonoscopy - benign polyps x 5. 1-year recall recommended. Mother deceased from colon cancer.  ? ?Return in 1 year for annual.  ? ? ?[place], 8:54 AM 08/26/2021 ? ?

## 2021-08-26 ENCOUNTER — Encounter: Payer: Self-pay | Admitting: Nurse Practitioner

## 2021-08-26 ENCOUNTER — Ambulatory Visit (INDEPENDENT_AMBULATORY_CARE_PROVIDER_SITE_OTHER): Payer: 59 | Admitting: Nurse Practitioner

## 2021-08-26 VITALS — BP 122/76 | Ht 62.0 in | Wt 130.0 lb

## 2021-08-26 DIAGNOSIS — Z833 Family history of diabetes mellitus: Secondary | ICD-10-CM | POA: Diagnosis not present

## 2021-08-26 DIAGNOSIS — E785 Hyperlipidemia, unspecified: Secondary | ICD-10-CM

## 2021-08-26 DIAGNOSIS — Z01419 Encounter for gynecological examination (general) (routine) without abnormal findings: Secondary | ICD-10-CM

## 2021-08-26 DIAGNOSIS — Z780 Asymptomatic menopausal state: Secondary | ICD-10-CM

## 2021-08-26 DIAGNOSIS — M85851 Other specified disorders of bone density and structure, right thigh: Secondary | ICD-10-CM | POA: Diagnosis not present

## 2021-08-26 DIAGNOSIS — M85852 Other specified disorders of bone density and structure, left thigh: Secondary | ICD-10-CM

## 2021-08-27 LAB — COMPREHENSIVE METABOLIC PANEL
AG Ratio: 2 (calc) (ref 1.0–2.5)
ALT: 11 U/L (ref 6–29)
AST: 18 U/L (ref 10–35)
Albumin: 4.7 g/dL (ref 3.6–5.1)
Alkaline phosphatase (APISO): 52 U/L (ref 37–153)
BUN: 14 mg/dL (ref 7–25)
CO2: 27 mmol/L (ref 20–32)
Calcium: 9.7 mg/dL (ref 8.6–10.4)
Chloride: 104 mmol/L (ref 98–110)
Creat: 0.84 mg/dL (ref 0.50–1.03)
Globulin: 2.4 g/dL (calc) (ref 1.9–3.7)
Glucose, Bld: 116 mg/dL — ABNORMAL HIGH (ref 65–99)
Potassium: 4.5 mmol/L (ref 3.5–5.3)
Sodium: 140 mmol/L (ref 135–146)
Total Bilirubin: 0.6 mg/dL (ref 0.2–1.2)
Total Protein: 7.1 g/dL (ref 6.1–8.1)

## 2021-08-27 LAB — LIPID PANEL
Cholesterol: 296 mg/dL — ABNORMAL HIGH (ref <200)
HDL: 82 mg/dL (ref >=50)
LDL Cholesterol (Calc): 186 mg/dL (calc) — ABNORMAL HIGH
Non-HDL Cholesterol (Calc): 214 mg/dL (calc) — ABNORMAL HIGH (ref <130)
Total CHOL/HDL Ratio: 3.6 (calc) (ref <5.0)
Triglycerides: 139 mg/dL (ref <150)

## 2021-08-27 LAB — CBC WITH DIFFERENTIAL/PLATELET
Absolute Monocytes: 273 cells/uL (ref 200–950)
Basophils Absolute: 8 cells/uL (ref 0–200)
Basophils Relative: 0.2 %
Eosinophils Absolute: 80 cells/uL (ref 15–500)
Eosinophils Relative: 1.9 %
HCT: 40.2 % (ref 35.0–45.0)
Hemoglobin: 13.6 g/dL (ref 11.7–15.5)
Lymphs Abs: 1054 cells/uL (ref 850–3900)
MCH: 32.9 pg (ref 27.0–33.0)
MCHC: 33.8 g/dL (ref 32.0–36.0)
MCV: 97.1 fL (ref 80.0–100.0)
MPV: 10.3 fL (ref 7.5–12.5)
Monocytes Relative: 6.5 %
Neutro Abs: 2785 cells/uL (ref 1500–7800)
Neutrophils Relative %: 66.3 %
Platelets: 254 10*3/uL (ref 140–400)
RBC: 4.14 10*6/uL (ref 3.80–5.10)
RDW: 12 % (ref 11.0–15.0)
Total Lymphocyte: 25.1 %
WBC: 4.2 10*3/uL (ref 3.8–10.8)

## 2021-08-27 LAB — HEMOGLOBIN A1C
Hgb A1c MFr Bld: 5.6 % of total Hgb (ref <5.7)
Mean Plasma Glucose: 114 mg/dL
eAG (mmol/L): 6.3 mmol/L

## 2021-09-16 ENCOUNTER — Encounter: Payer: Self-pay | Admitting: Internal Medicine

## 2021-09-21 NOTE — Progress Notes (Signed)
? ? ? ? ?Subjective:  ? ? Patient ID: Tara Carpenter, female    DOB: Oct 06, 1960, 61 y.o.   MRN: [id number] ? ?This visit occurred during the SARS-CoV-2 public health emergency.  Safety protocols were in place, including screening questions prior to the visit, additional usage of staff PPE, and extensive cleaning of exam room while observing appropriate contact time as indicated for disinfecting solutions.   ? ? ?HPI ?Poppi is here for follow up of her chronic medical problems, including elevated cholesterol.  She just had blood work done and ldl was high. ?  ?Tried crestor '40mg'$  in 2017 - severe joint pain ? ? ?Dad had heart disease.  Strong family history of hyperlipidemia.  Both parents had diabetes. ? ? ? ?Medications and allergies reviewed with patient and updated if appropriate. ? ?Current Outpatient Medications on File Prior to Visit  ?Medication Sig Dispense Refill  ? albuterol (VENTOLIN HFA) 108 (90 Base) MCG/ACT inhaler INHALE 1 TO 2 PUFFS INTO THE LUNGS EVERY 6 HOURS AS NEEDED FOR WHEEZING OR SHORTNESS OF BREATH 8.5 g 0  ? budesonide (PULMICORT) 0.25 MG/2ML nebulizer solution Take 0.25 mg by nebulization daily. Nasal rinse    ? Calcium Carbonate-Vitamin D (CALCIUM + D PO) Take 1 tablet by mouth daily.    ? Cetirizine HCl (ZYRTEC ALLERGY PO) Take 1 tablet by mouth daily.    ? Dupilumab (DUPIXENT) 300 MG/2ML SOPN Inject into the skin.    ? famotidine (PEPCID) 20 MG tablet Take 1 tablet (20 mg total) by mouth at bedtime. (Patient taking differently: Take 20 mg by mouth at bedtime as needed.) 90 tablet 1  ? Multiple Vitamin (MULTIVITAMIN) capsule Take 1 capsule by mouth daily.    ? SYMBICORT 160-4.5 MCG/ACT inhaler INHALE 2 PUFFS INTO THE LUNGS TWICE DAILY 10.2 g 0  ? Montelukast Sodium (SINGULAIR PO) Take 1 tablet by mouth daily. (Patient not taking: Reported on 09/25/2021)    ? ?No current facility-administered medications on file prior to visit.  ? ? ? ?Review of Systems  ?Respiratory:  Negative for shortness  of breath.   ?Cardiovascular:  Negative for chest pain and palpitations.  ? ?   ?Objective:  ? ?Vitals:  ? 09/25/21 1059  ?BP: 120/76  ?Pulse: 82  ?Temp: 98.1 ?F (36.7 ?C)  ?SpO2: 97%  ? ?BP Readings from Last 3 Encounters:  ?09/25/21 120/76  ?08/26/21 122/76  ?08/20/20 118/76  ? ?Wt Readings from Last 3 Encounters:  ?09/25/21 132 lb (59.9 kg)  ?08/26/21 130 lb (59 kg)  ?08/20/20 133 lb (60.3 kg)  ? ?Body mass index is 24.14 kg/m?. ? ?  ?Physical Exam ?Constitutional:   ?   Appearance: Normal appearance.  ?HENT:  ?   Head: Normocephalic.  ?Skin: ?   General: Skin is warm and dry.  ?Neurological:  ?   Mental Status: She is alert. Mental status is at baseline.  ? ?   ? ?Lab Results  ?Component Value Date  ? WBC 4.2 08/26/2021  ? HGB 13.6 08/26/2021  ? HCT 40.2 08/26/2021  ? PLT 254 08/26/2021  ? GLUCOSE 116 (H) 08/26/2021  ? CHOL 296 (H) 08/26/2021  ? TRIG 139 08/26/2021  ? HDL 82 08/26/2021  ? LDLCALC 186 (H) 08/26/2021  ? ALT 11 08/26/2021  ? AST 18 08/26/2021  ? NA 140 08/26/2021  ? K 4.5 08/26/2021  ? CL 104 08/26/2021  ? CREATININE 0.84 08/26/2021  ? BUN 14 08/26/2021  ? CO2 27 08/26/2021  ? TSH  1.52 08/20/2020  ? INR 0.9 01/04/2015  ? HGBA1C 5.6 08/26/2021  ? ? ? ?Assessment & Plan:  ? ? ?See Problem List for Assessment and Plan of chronic medical problems.  ? ? ?

## 2021-09-25 ENCOUNTER — Encounter: Payer: Self-pay | Admitting: Internal Medicine

## 2021-09-25 ENCOUNTER — Ambulatory Visit (INDEPENDENT_AMBULATORY_CARE_PROVIDER_SITE_OTHER): Payer: Commercial Managed Care - PPO | Admitting: Internal Medicine

## 2021-09-25 VITALS — BP 120/76 | HR 82 | Temp 98.1°F | Ht 62.0 in | Wt 132.0 lb

## 2021-09-25 DIAGNOSIS — Z136 Encounter for screening for cardiovascular disorders: Secondary | ICD-10-CM | POA: Diagnosis not present

## 2021-09-25 DIAGNOSIS — E782 Mixed hyperlipidemia: Secondary | ICD-10-CM

## 2021-09-25 MED ORDER — ROSUVASTATIN CALCIUM 5 MG PO TABS: 5.0000 mg | ORAL_TABLET | ORAL | 3 refills | Status: DC

## 2021-09-25 NOTE — Patient Instructions (Signed)
? ? ? ?  Blood work was ordered - have this done 6 weeks after after starting the medication. .   ? ? ?Medications changes include :   crestor 5 mg three times a week ? ? ?Your prescription(s) have been sent to your pharmacy.  ? ? ? ?

## 2021-09-25 NOTE — Assessment & Plan Note (Signed)
Chronic ?Had recent blood work done - LDL still high which is likely genetic ?CT coronary calcium score ?Start crestor 5 mg TIW - I think she will be able to tolerate this at a low dose ?Hepatic function, lipids 6 weeks after starting crestor ?

## 2021-09-30 ENCOUNTER — Ambulatory Visit (INDEPENDENT_AMBULATORY_CARE_PROVIDER_SITE_OTHER): Payer: Commercial Managed Care - PPO

## 2021-09-30 DIAGNOSIS — Z780 Asymptomatic menopausal state: Secondary | ICD-10-CM | POA: Diagnosis not present

## 2021-09-30 DIAGNOSIS — M85851 Other specified disorders of bone density and structure, right thigh: Secondary | ICD-10-CM | POA: Diagnosis not present

## 2021-09-30 DIAGNOSIS — Z13820 Encounter for screening for osteoporosis: Secondary | ICD-10-CM

## 2021-09-30 DIAGNOSIS — M85852 Other specified disorders of bone density and structure, left thigh: Secondary | ICD-10-CM | POA: Diagnosis not present

## 2021-10-22 ENCOUNTER — Encounter: Payer: Self-pay | Admitting: Nurse Practitioner

## 2021-11-10 ENCOUNTER — Ambulatory Visit
Admission: RE | Admit: 2021-11-10 | Discharge: 2021-11-10 | Disposition: A | Discharge status: Home / Self Care | Payer: Self-pay | Source: Ambulatory Visit | Attending: Internal Medicine | Admitting: Internal Medicine

## 2021-11-10 DIAGNOSIS — Z136 Encounter for screening for cardiovascular disorders: Secondary | ICD-10-CM

## 2021-11-12 ENCOUNTER — Other Ambulatory Visit: Payer: Self-pay | Admitting: Internal Medicine

## 2021-11-12 DIAGNOSIS — E278 Other specified disorders of adrenal gland: Secondary | ICD-10-CM | POA: Insufficient documentation

## 2021-11-12 DIAGNOSIS — R931 Abnormal findings on diagnostic imaging of heart and coronary circulation: Secondary | ICD-10-CM

## 2021-11-12 LAB — HM COLONOSCOPY

## 2021-11-13 ENCOUNTER — Other Ambulatory Visit: Payer: Commercial Managed Care - PPO

## 2021-11-14 ENCOUNTER — Encounter: Payer: Self-pay | Admitting: Nurse Practitioner

## 2021-11-14 NOTE — Telephone Encounter (Signed)
I called patient and informed her of bone density results. All questions answered.

## 2021-11-17 ENCOUNTER — Encounter: Payer: Self-pay | Admitting: Internal Medicine

## 2021-11-19 NOTE — Addendum Note (Signed)
Addended by: Binnie Rail on: 11/19/2021 11:32 AM   Modules accepted: Orders

## 2021-11-28 ENCOUNTER — Encounter: Payer: Self-pay | Admitting: Internal Medicine

## 2021-11-28 NOTE — Progress Notes (Signed)
Outside notes received. Information abstracted. Notes sent to scan.  

## 2021-12-17 ENCOUNTER — Ambulatory Visit
Admission: RE | Admit: 2021-12-17 | Discharge: 2021-12-17 | Disposition: A | Discharge status: Home / Self Care | Payer: Commercial Managed Care - PPO | Source: Ambulatory Visit | Attending: Internal Medicine | Admitting: Internal Medicine

## 2021-12-17 DIAGNOSIS — E278 Other specified disorders of adrenal gland: Secondary | ICD-10-CM

## 2021-12-17 MED ORDER — IOPAMIDOL (ISOVUE-300) INJECTION 61%
100.0000 mL | Freq: Once | INTRAVENOUS | Status: AC | PRN
  Administered 2021-12-17: 100 mL via INTRAVENOUS

## 2022-01-06 ENCOUNTER — Encounter: Payer: Self-pay | Admitting: Internal Medicine

## 2022-01-13 ENCOUNTER — Other Ambulatory Visit (INDEPENDENT_AMBULATORY_CARE_PROVIDER_SITE_OTHER): Payer: Commercial Managed Care - PPO

## 2022-01-13 DIAGNOSIS — E782 Mixed hyperlipidemia: Secondary | ICD-10-CM

## 2022-01-13 LAB — HEPATIC FUNCTION PANEL
ALT: 13 U/L (ref 0–35)
AST: 18 U/L (ref 0–37)
Albumin: 4.4 g/dL (ref 3.5–5.2)
Alkaline Phosphatase: 46 U/L (ref 39–117)
Bilirubin, Direct: 0.1 mg/dL (ref 0.0–0.3)
Total Bilirubin: 0.6 mg/dL (ref 0.2–1.2)
Total Protein: 6.8 g/dL (ref 6.0–8.3)

## 2022-01-13 LAB — LIPID PANEL
Cholesterol: 223 mg/dL — ABNORMAL HIGH (ref 0–200)
HDL: 70.3 mg/dL (ref >39.00)
LDL Cholesterol: 124 mg/dL — ABNORMAL HIGH (ref 0–99)
NonHDL: 152.7
Total CHOL/HDL Ratio: 3
Triglycerides: 145 mg/dL (ref 0.0–149.0)
VLDL: 29 mg/dL (ref 0.0–40.0)

## 2022-02-11 ENCOUNTER — Other Ambulatory Visit: Payer: Self-pay

## 2022-02-11 ENCOUNTER — Other Ambulatory Visit: Payer: Self-pay | Admitting: Internal Medicine

## 2022-05-10 ENCOUNTER — Other Ambulatory Visit: Payer: Self-pay | Admitting: Internal Medicine

## 2022-08-11 ENCOUNTER — Other Ambulatory Visit: Payer: Self-pay | Admitting: Internal Medicine

## 2022-08-11 ENCOUNTER — Other Ambulatory Visit: Payer: Self-pay

## 2022-09-16 ENCOUNTER — Encounter: Payer: Self-pay | Admitting: Nurse Practitioner

## 2022-09-17 ENCOUNTER — Encounter: Payer: Self-pay | Admitting: Nurse Practitioner

## 2022-09-17 ENCOUNTER — Ambulatory Visit (INDEPENDENT_AMBULATORY_CARE_PROVIDER_SITE_OTHER): Payer: Commercial Managed Care - PPO | Admitting: Nurse Practitioner

## 2022-09-17 VITALS — BP 108/82 | HR 67 | Ht 61.75 in | Wt 134.0 lb

## 2022-09-17 DIAGNOSIS — M85851 Other specified disorders of bone density and structure, right thigh: Secondary | ICD-10-CM

## 2022-09-17 DIAGNOSIS — Z01419 Encounter for gynecological examination (general) (routine) without abnormal findings: Secondary | ICD-10-CM | POA: Diagnosis not present

## 2022-09-17 DIAGNOSIS — Z780 Asymptomatic menopausal state: Secondary | ICD-10-CM

## 2022-09-17 DIAGNOSIS — Z87898 Personal history of other specified conditions: Secondary | ICD-10-CM

## 2022-09-17 DIAGNOSIS — E785 Hyperlipidemia, unspecified: Secondary | ICD-10-CM

## 2022-09-17 NOTE — Progress Notes (Signed)
Tara Carpenter 07-20-1960 [id number]   History:  62 y.o. [id number] presents for annual exam without GYN complaints. Postmenopausal - no HRT, no bleeding. Abnormal pap > 25 years ago, subsequent paps normal. Normal mammogram history.  Gynecologic History Patient's last menstrual period was 07/15/2011.   Contraception/Family planning: post menopausal status Sexually active: Yes  Health Maintenance Last Pap: 07/19/2018. Results were: Normal neg HPV, 5-year repeat Last mammogram: 10/21/2021. Results were: Normal Last colonoscopy: 11/12/2021. Results were: Benign polyp, 5-year recall Last Dexa: 09/30/2021. Results were: T-score -1.9, FRAX 8.8% / 1.0%  Past medical history, past surgical history, family history and social history were all reviewed and documented in the EPIC chart. 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 Father had diabetes, deceased from heart disease in his 20s. Mother was diabetic, deceased from colon cancer.   ROS:  A ROS was performed and pertinent positives and negatives are included.  Exam:  Vitals:   09/17/22 0901  BP: 108/82  Pulse: 67  SpO2: 97%  Weight: 134 lb (60.8 kg)  Height: 5' 1.75" (1.568 m)     Body mass index is 24.71 kg/m.  General appearance:  Normal Thyroid:  Symmetrical, normal in size, without palpable masses or nodularity. Respiratory  Auscultation:  Clear without wheezing or rhonchi Cardiovascular  Auscultation:  Regular rate, without rubs, murmurs or gallops  Edema/varicosities:  Not grossly evident Abdominal  Soft,nontender, without masses, guarding or rebound.  Liver/spleen:  No organomegaly noted  Hernia:  None appreciated  Skin  Inspection:  Grossly normal   Breasts: Examined lying and sitting.   Right: Without masses, retractions, discharge or axillary adenopathy.   Left: Without masses, retractions, discharge or axillary  adenopathy. Genitourinary   Inguinal/mons:  Normal without inguinal adenopathy  External genitalia:  Normal appearing vulva with no masses, tenderness, or lesions  BUS/Urethra/Skene's glands:  Normal  Vagina:  Normal appearing with normal color and discharge, no lesions. Atrophic changes  Cervix:  Normal appearing without discharge or lesions  Uterus:  Normal in size, shape and contour.  Midline and mobile, nontender  Adnexa/parametria:     Rt: Normal in size, without masses or tenderness.   Lt: Normal in size, without masses or tenderness.  Anus and perineum: Normal  Digital rectal exam: Deferred  Patient informed chaperone available to be present for breast and pelvic exam. Patient has requested no chaperone to be present. Patient has been advised what will be completed during breast and pelvic exam.   Assessment/Plan:  62 y.o. [id number] for annual exam.   Well female exam with routine gynecological exam - Plan: CBC with Differential/Platelet, Comprehensive metabolic panel. Education provided on SBEs, importance of preventative screenings, current guidelines, high calcium diet, regular exercise, and multivitamin daily.   Postmenopausal - no HRT, no bleeding  History of prediabetes - Plan: Hemoglobin A1c. Both parents were diabetic. Patient has been prediabetic in the past.   Osteopenia of neck of right femur - T-score -1.9 without elevated FRAX 09/2021. Recommend Vitamin D supplement, high calcium diet, and exercise. We will repeat DXA now.   Hyperlipidemia, unspecified hyperlipidemia type - Plan: Lipid panel. Managed by PCP. Taking Crestor.   Screening for cervical cancer - Abnormal pap > 25 years ago, subsequent paps normal. Will repeat at 5-year interval per guidelines.  Screening for breast cancer - Normal mammogram history.  Continue annual screenings.  Normal breast exam today.  Screening for colon cancer -  10/2021 colonoscopy. Will repeat at GI's recommended interval. Mother  deceased from colon cancer.   Return in 1 year for annual.      [name] DNP, 9:14 AM 09/17/2022

## 2022-09-18 LAB — CBC WITH DIFFERENTIAL/PLATELET
Absolute Monocytes: 250 cells/uL (ref 200–950)
Basophils Absolute: 20 cells/uL (ref 0–200)
Basophils Relative: 0.4 %
Eosinophils Absolute: 51 cells/uL (ref 15–500)
Eosinophils Relative: 1 %
HCT: 36.9 % (ref 35.0–45.0)
Hemoglobin: 12.8 g/dL (ref 11.7–15.5)
Lymphs Abs: 765 cells/uL — ABNORMAL LOW (ref 850–3900)
MCH: 32.9 pg (ref 27.0–33.0)
MCHC: 34.7 g/dL (ref 32.0–36.0)
MCV: 94.9 fL (ref 80.0–100.0)
MPV: 10.6 fL (ref 7.5–12.5)
Monocytes Relative: 4.9 %
Neutro Abs: 4014 cells/uL (ref 1500–7800)
Neutrophils Relative %: 78.7 %
Platelets: 213 10*3/uL (ref 140–400)
RBC: 3.89 10*6/uL (ref 3.80–5.10)
RDW: 11.8 % (ref 11.0–15.0)
Total Lymphocyte: 15 %
WBC: 5.1 10*3/uL (ref 3.8–10.8)

## 2022-09-18 LAB — COMPREHENSIVE METABOLIC PANEL
AG Ratio: 2.2 (calc) (ref 1.0–2.5)
ALT: 14 U/L (ref 6–29)
AST: 20 U/L (ref 10–35)
Albumin: 4.6 g/dL (ref 3.6–5.1)
Alkaline phosphatase (APISO): 49 U/L (ref 37–153)
BUN: 16 mg/dL (ref 7–25)
CO2: 21 mmol/L (ref 20–32)
Calcium: 9.3 mg/dL (ref 8.6–10.4)
Chloride: 106 mmol/L (ref 98–110)
Creat: 0.84 mg/dL (ref 0.50–1.05)
Globulin: 2.1 g/dL (calc) (ref 1.9–3.7)
Glucose, Bld: 91 mg/dL (ref 65–99)
Potassium: 4.6 mmol/L (ref 3.5–5.3)
Sodium: 140 mmol/L (ref 135–146)
Total Bilirubin: 0.5 mg/dL (ref 0.2–1.2)
Total Protein: 6.7 g/dL (ref 6.1–8.1)

## 2022-09-18 LAB — LIPID PANEL
Cholesterol: 237 mg/dL — ABNORMAL HIGH (ref <200)
HDL: 78 mg/dL (ref >=50)
LDL Cholesterol (Calc): 139 mg/dL (calc) — ABNORMAL HIGH
Non-HDL Cholesterol (Calc): 159 mg/dL (calc) — ABNORMAL HIGH (ref <130)
Total CHOL/HDL Ratio: 3 (calc) (ref <5.0)
Triglycerides: 95 mg/dL (ref <150)

## 2022-09-18 LAB — HEMOGLOBIN A1C
Hgb A1c MFr Bld: 5.9 % of total Hgb — ABNORMAL HIGH (ref <5.7)
Mean Plasma Glucose: 123 mg/dL
eAG (mmol/L): 6.8 mmol/L

## 2022-10-28 ENCOUNTER — Encounter: Payer: Self-pay | Admitting: Nurse Practitioner

## 2022-11-18 ENCOUNTER — Other Ambulatory Visit: Payer: Self-pay | Admitting: Internal Medicine

## 2023-02-08 ENCOUNTER — Other Ambulatory Visit: Payer: Self-pay | Admitting: Internal Medicine

## 2023-02-10 ENCOUNTER — Other Ambulatory Visit: Payer: Self-pay

## 2023-02-10 ENCOUNTER — Other Ambulatory Visit: Payer: Self-pay | Admitting: Internal Medicine

## 2023-02-10 MED ORDER — ROSUVASTATIN CALCIUM 5 MG PO TABS: 5.0000 mg | ORAL_TABLET | Freq: Every day | ORAL | 0 refills | Status: DC

## 2023-02-25 ENCOUNTER — Encounter: Payer: Self-pay | Admitting: Internal Medicine

## 2023-02-25 DIAGNOSIS — R7303 Prediabetes: Secondary | ICD-10-CM | POA: Insufficient documentation

## 2023-02-25 NOTE — Progress Notes (Signed)
Subjective:    Patient ID: Tara Carpenter, female    DOB: [birth year], 62 y.o.   MRN: [id number]     HPI Tara Carpenter is here for follow up of her chronic medical problems.    She is exercising regularly.   She is eating well.    Medications and allergies reviewed with patient and updated if appropriate.  Current Outpatient Medications on File Prior to Visit  Medication Sig Dispense Refill   albuterol (VENTOLIN HFA) 108 (90 Base) MCG/ACT inhaler INHALE 1 TO 2 PUFFS INTO THE LUNGS EVERY 6 HOURS AS NEEDED FOR WHEEZING OR SHORTNESS OF BREATH 8.5 g 0   Calcium Carbonate-Vitamin D (CALCIUM + D PO) Take 1 tablet by mouth daily.     Cetirizine HCl (ZYRTEC ALLERGY PO) Take 1 tablet by mouth daily.     Dupilumab (DUPIXENT) 300 MG/2ML SOPN Inject into the skin. Monthly     famotidine (PEPCID) 20 MG tablet Take 1 tablet (20 mg total) by mouth at bedtime. (Patient taking differently: Take 20 mg by mouth at bedtime as needed.) 90 tablet 1   Multiple Vitamin (MULTIVITAMIN) capsule Take 1 capsule by mouth daily.     omeprazole (PRILOSEC) 20 MG capsule Take by mouth.     rosuvastatin (CRESTOR) 5 MG tablet TAKE 1 TABLET(5 MG) BY MOUTH DAILY 90 tablet 0   SYMBICORT 160-4.5 MCG/ACT inhaler INHALE 2 PUFFS INTO THE LUNGS TWICE DAILY 10.2 g 0   No current facility-administered medications on file prior to visit.     Review of Systems  Constitutional:  Negative for fever.  Respiratory:  Negative for cough, shortness of breath and wheezing.   Cardiovascular:  Negative for chest pain, palpitations and leg swelling.  Neurological:  Negative for light-headedness and headaches.       Objective:   Vitals:   02/26/23 1315  BP: 116/70  Pulse: 67  Temp: 98 F (36.7 C)  SpO2: 98%   BP Readings from Last 3 Encounters:  02/26/23 116/70  09/17/22 108/82  09/25/21 120/76   Wt Readings from Last 3 Encounters:  02/26/23 135 lb (61.2 kg)  09/17/22 134 lb (60.8 kg)  09/25/21 132 lb (59.9 kg)    Body mass index is 24.89 kg/m.    Physical Exam Constitutional:      General: She is not in acute distress.    Appearance: Normal appearance.  HENT:     Head: Normocephalic and atraumatic.  Eyes:     Conjunctiva/sclera: Conjunctivae normal.  Cardiovascular:     Rate and Rhythm: Normal rate and regular rhythm.     Heart sounds: Normal heart sounds.  Pulmonary:     Effort: Pulmonary effort is normal. No respiratory distress.     Breath sounds: Normal breath sounds. No wheezing.  Musculoskeletal:     Cervical back: Neck supple.     Right lower leg: No edema.     Left lower leg: No edema.  Lymphadenopathy:     Cervical: No cervical adenopathy.  Skin:    General: Skin is warm and dry.     Findings: No rash.  Neurological:     Mental Status: She is alert. Mental status is at baseline.  Psychiatric:        Mood and Affect: Mood normal.        Behavior: Behavior normal.        Lab Results  Component Value Date   WBC 5.1 09/17/2022   HGB 12.8 09/17/2022   HCT  36.9 09/17/2022   PLT 213 09/17/2022   GLUCOSE 91 09/17/2022   CHOL 237 (H) 09/17/2022   TRIG 95 09/17/2022   HDL 78 09/17/2022   LDLCALC 139 (H) 09/17/2022   ALT 14 09/17/2022   AST 20 09/17/2022   NA 140 09/17/2022   K 4.6 09/17/2022   CL 106 09/17/2022   CREATININE 0.84 09/17/2022   BUN 16 09/17/2022   CO2 21 09/17/2022   TSH 1.52 08/20/2020   INR 0.9 01/04/2015   HGBA1C 5.9 (H) 09/17/2022     Assessment & Plan:    See Problem List for Assessment and Plan of chronic medical problems.

## 2023-02-25 NOTE — Patient Instructions (Addendum)
      Blood work was ordered.   The lab is on the first floor.    Medications changes include :   none      Return in about 6 months (around 08/27/2023) for Physical Exam.

## 2023-02-26 ENCOUNTER — Ambulatory Visit (INDEPENDENT_AMBULATORY_CARE_PROVIDER_SITE_OTHER): Payer: Commercial Managed Care - PPO | Admitting: Internal Medicine

## 2023-02-26 VITALS — BP 116/70 | HR 67 | Temp 98.0°F | Ht 61.75 in | Wt 135.0 lb

## 2023-02-26 DIAGNOSIS — J45991 Cough variant asthma: Secondary | ICD-10-CM

## 2023-02-26 DIAGNOSIS — R931 Abnormal findings on diagnostic imaging of heart and coronary circulation: Secondary | ICD-10-CM | POA: Diagnosis not present

## 2023-02-26 DIAGNOSIS — R7303 Prediabetes: Secondary | ICD-10-CM | POA: Diagnosis not present

## 2023-02-26 DIAGNOSIS — E782 Mixed hyperlipidemia: Secondary | ICD-10-CM

## 2023-02-26 MED ORDER — ALBUTEROL SULFATE HFA 108 (90 BASE) MCG/ACT IN AERS: 1.0000 | INHALATION_SPRAY | Freq: Four times a day (QID) | RESPIRATORY_TRACT | 11 refills | Status: AC | PRN

## 2023-02-26 NOTE — Assessment & Plan Note (Signed)
Chronic Very low coronary artery calcium score Taking Crestor 5 mg 3 times a week-will continue and check lipid panel Continue healthy eating, regular exercise

## 2023-02-26 NOTE — Assessment & Plan Note (Signed)
Chronic Regular exercise and healthy diet encouraged Check lipid panel, CMP Continue Crestor 5 mg 3 times a week

## 2023-02-26 NOTE — Assessment & Plan Note (Signed)
Chronic Mild, intermittent Uses albuterol inhaler as needed-continue and refill sent to pharmacy Not currently using Symbicort, but has it at home to use if needed

## 2023-02-26 NOTE — Assessment & Plan Note (Signed)
Chronic Check a1c Low sugar / carb diet Stressed regular exercise   

## 2023-05-14 ENCOUNTER — Other Ambulatory Visit (INDEPENDENT_AMBULATORY_CARE_PROVIDER_SITE_OTHER): Payer: Commercial Managed Care - PPO

## 2023-05-14 DIAGNOSIS — R7303 Prediabetes: Secondary | ICD-10-CM

## 2023-05-14 DIAGNOSIS — E782 Mixed hyperlipidemia: Secondary | ICD-10-CM

## 2023-05-14 LAB — COMPREHENSIVE METABOLIC PANEL
ALT: 13 U/L (ref 0–35)
AST: 19 U/L (ref 0–37)
Albumin: 4.4 g/dL (ref 3.5–5.2)
Alkaline Phosphatase: 45 U/L (ref 39–117)
BUN: 12 mg/dL (ref 6–23)
CO2: 28 meq/L (ref 19–32)
Calcium: 9.3 mg/dL (ref 8.4–10.5)
Chloride: 104 meq/L (ref 96–112)
Creatinine, Ser: 0.82 mg/dL (ref 0.40–1.20)
GFR: 77.28 mL/min (ref >60.00)
Glucose, Bld: 110 mg/dL — ABNORMAL HIGH (ref 70–99)
Potassium: 4.2 meq/L (ref 3.5–5.1)
Sodium: 140 meq/L (ref 135–145)
Total Bilirubin: 0.5 mg/dL (ref 0.2–1.2)
Total Protein: 6.9 g/dL (ref 6.0–8.3)

## 2023-05-14 LAB — HEMOGLOBIN A1C: Hgb A1c MFr Bld: 6 % (ref 4.6–6.5)

## 2023-05-14 LAB — LIPID PANEL
Cholesterol: 253 mg/dL — ABNORMAL HIGH (ref 0–200)
HDL: 79.3 mg/dL (ref >39.00)
LDL Cholesterol: 157 mg/dL — ABNORMAL HIGH (ref 0–99)
NonHDL: 173.21
Total CHOL/HDL Ratio: 3
Triglycerides: 79 mg/dL (ref 0.0–149.0)
VLDL: 15.8 mg/dL (ref 0.0–40.0)

## 2023-05-16 ENCOUNTER — Other Ambulatory Visit: Payer: Self-pay | Admitting: Internal Medicine

## 2023-05-17 ENCOUNTER — Encounter: Payer: Self-pay | Admitting: Internal Medicine

## 2023-05-17 DIAGNOSIS — E782 Mixed hyperlipidemia: Secondary | ICD-10-CM

## 2023-08-29 NOTE — Progress Notes (Unsigned)
    Subjective:    Patient ID: Tara Carpenter, female    DOB: 10-18-1960, 63 y.o.   MRN: [id number]      HPI Tara Carpenter is here for No chief complaint on file.  She is here for an acute visit for cold symptoms.   Her symptoms started   She is experiencing   She has tried taking        Medications and allergies reviewed with patient and updated if appropriate.  Current Outpatient Medications on File Prior to Visit  Medication Sig Dispense Refill   albuterol (VENTOLIN HFA) 108 (90 Base) MCG/ACT inhaler Inhale 1-2 puffs into the lungs every 6 (six) hours as needed for wheezing or shortness of breath. 8.5 g 11   Calcium Carbonate-Vitamin D (CALCIUM + D PO) Take 1 tablet by mouth daily.     Cetirizine HCl (ZYRTEC ALLERGY PO) Take 1 tablet by mouth daily.     Dupilumab (DUPIXENT) 300 MG/2ML SOPN Inject into the skin. Monthly     famotidine (PEPCID) 20 MG tablet Take 1 tablet (20 mg total) by mouth at bedtime. (Patient taking differently: Take 20 mg by mouth at bedtime as needed.) 90 tablet 1   Multiple Vitamin (MULTIVITAMIN) capsule Take 1 capsule by mouth daily.     rosuvastatin (CRESTOR) 5 MG tablet TAKE 1 TABLET(5 MG) BY MOUTH DAILY 90 tablet 0   SYMBICORT 160-4.5 MCG/ACT inhaler INHALE 2 PUFFS INTO THE LUNGS TWICE DAILY 10.2 g 0   No current facility-administered medications on file prior to visit.    Review of Systems     Objective:  There were no vitals filed for this visit. BP Readings from Last 3 Encounters:  02/26/23 116/70  09/17/22 108/82  09/25/21 120/76   Wt Readings from Last 3 Encounters:  02/26/23 135 lb (61.2 kg)  09/17/22 134 lb (60.8 kg)  09/25/21 132 lb (59.9 kg)   There is no height or weight on file to calculate BMI.    Physical Exam Constitutional:      General: She is not in acute distress.    Appearance: Normal appearance. She is not ill-appearing.  HENT:     Head: Normocephalic and atraumatic.     Right Ear: Tympanic membrane, ear canal  and external ear normal.     Left Ear: Tympanic membrane, ear canal and external ear normal.     Mouth/Throat:     Mouth: Mucous membranes are moist.     Pharynx: No oropharyngeal exudate or posterior oropharyngeal erythema.  Eyes:     Conjunctiva/sclera: Conjunctivae normal.  Cardiovascular:     Rate and Rhythm: Normal rate and regular rhythm.  Pulmonary:     Effort: Pulmonary effort is normal. No respiratory distress.     Breath sounds: Normal breath sounds. No wheezing or rales.  Musculoskeletal:     Cervical back: Neck supple. No tenderness.  Lymphadenopathy:     Cervical: No cervical adenopathy.  Skin:    General: Skin is warm and dry.  Neurological:     Mental Status: She is alert.            Assessment & Plan:    See Problem List for Assessment and Plan of chronic medical problems.

## 2023-08-29 NOTE — Patient Instructions (Addendum)
       Medications changes include :   tussionex, zpak, prednisone, symbicort     Return if symptoms worsen or fail to improve.

## 2023-08-30 ENCOUNTER — Ambulatory Visit (INDEPENDENT_AMBULATORY_CARE_PROVIDER_SITE_OTHER): Admitting: Internal Medicine

## 2023-08-30 ENCOUNTER — Encounter: Payer: Self-pay | Admitting: Internal Medicine

## 2023-08-30 VITALS — BP 132/72 | HR 78 | Temp 98.4°F | Ht 61.75 in | Wt 136.0 lb

## 2023-08-30 DIAGNOSIS — J4541 Moderate persistent asthma with (acute) exacerbation: Secondary | ICD-10-CM

## 2023-08-30 DIAGNOSIS — J45901 Unspecified asthma with (acute) exacerbation: Secondary | ICD-10-CM | POA: Insufficient documentation

## 2023-08-30 DIAGNOSIS — J069 Acute upper respiratory infection, unspecified: Secondary | ICD-10-CM | POA: Diagnosis not present

## 2023-08-30 DIAGNOSIS — R051 Acute cough: Secondary | ICD-10-CM | POA: Diagnosis not present

## 2023-08-30 LAB — POC INFLUENZA A&B (BINAX/QUICKVUE)
Influenza A, POC: NEGATIVE
Influenza B, POC: NEGATIVE

## 2023-08-30 LAB — POC COVID19 BINAXNOW: SARS Coronavirus 2 Ag: NEGATIVE

## 2023-08-30 MED ORDER — HYDROCOD POLI-CHLORPHE POLI ER 10-8 MG/5ML PO SUER
5.0000 mL | Freq: Two times a day (BID) | ORAL | 0 refills | Status: DC | PRN
Start: 2023-08-30 — End: 2023-12-15

## 2023-08-30 MED ORDER — AZITHROMYCIN 250 MG PO TABS: ORAL_TABLET | ORAL | 0 refills | Status: DC

## 2023-08-30 MED ORDER — PREDNISONE 20 MG PO TABS: 40.0000 mg | ORAL_TABLET | Freq: Every day | ORAL | 0 refills | Status: AC

## 2023-08-30 MED ORDER — BUDESONIDE-FORMOTEROL FUMARATE 160-4.5 MCG/ACT IN AERO: 2.0000 | INHALATION_SPRAY | Freq: Two times a day (BID) | RESPIRATORY_TRACT | 2 refills | Status: AC

## 2023-08-30 NOTE — Assessment & Plan Note (Signed)
 Acute Secondary to URI With wheezing on exam Start Z-Pak Tussionex cough syrup Prednisone 40 mg daily x 5 days Continue albuterol inhaler as needed Will prescribe Symbicort to use twice daily as needed-does not have any at home Over-the-counter cold medications if needed Rest, fluids Call if no improvement

## 2023-08-30 NOTE — Assessment & Plan Note (Signed)
 Acute With asthma exacerbation COVID and flu test here negative With wheezing on exam Start Z-Pak Tussionex cough syrup Prednisone 40 mg daily x 5 days Continue albuterol inhaler as needed Will prescribe Symbicort to use twice daily as needed-does not have any at home Over-the-counter cold medications if needed Rest, fluids Call if no improvement

## 2023-10-12 ENCOUNTER — Ambulatory Visit: Payer: Commercial Managed Care - PPO | Admitting: Nurse Practitioner

## 2023-11-02 LAB — HM MAMMOGRAPHY

## 2023-11-03 ENCOUNTER — Encounter: Payer: Self-pay | Admitting: Nurse Practitioner

## 2023-11-04 IMAGING — CT CT CARDIAC CORONARY ARTERY CALCIUM SCORE
3 series · 14 of 20 positions shown, 16 images · non-contrast
Comparison: None Available.
COMPARISON: None Available.

Addendum:
EXAM:
OVER-READ INTERPRETATION  CT CHEST

The following report is a limited chest CT over-read performed by
11/10/2021. The calcium coronary score interpretation by the
cardiologist is attached.
CLINICAL DATA: Cardiovascular Disease Risk stratification
Coronary Calcium Score
TECHNIQUE: A gated, non-contrast computed tomography scan of the heart was
performed using 3mm slice thickness. Axial images were analyzed on a
dedicated workstation. Calcium scoring of the coronary arteries was
performed using the Agatston method.

[Series 2: cascseq 2.0 sa36 70% (id) · axial · 0.39mm/px · z∈[-211,-121]mm · 4 of 76 slices shown]
[im 16/76  vessel]
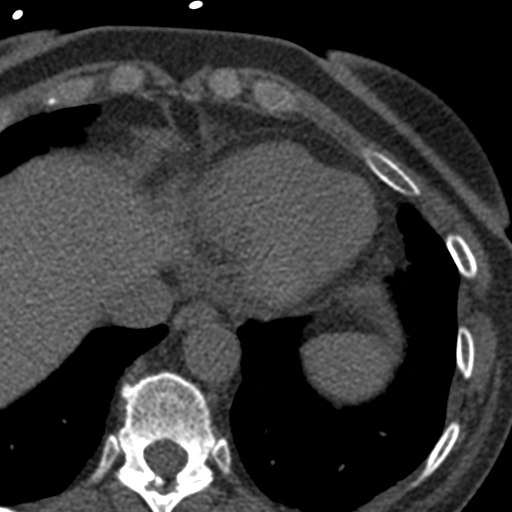
[im 31/76  vessel]
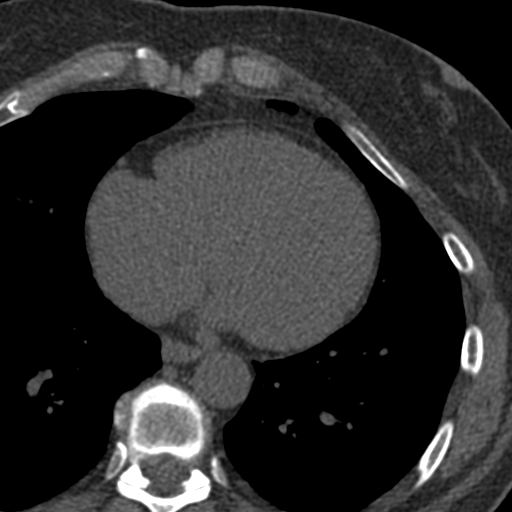
[im 46/76  vessel]
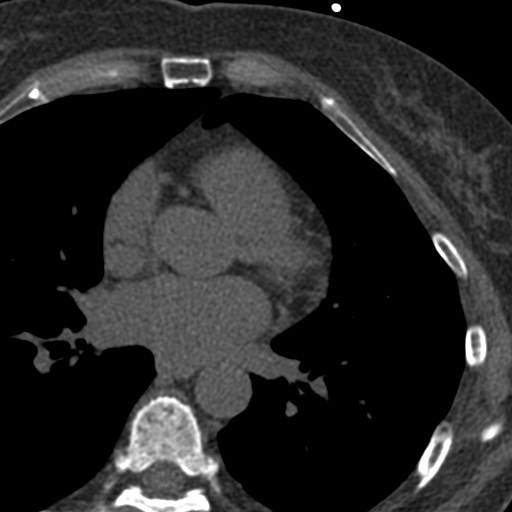
[im 61/76  vessel]
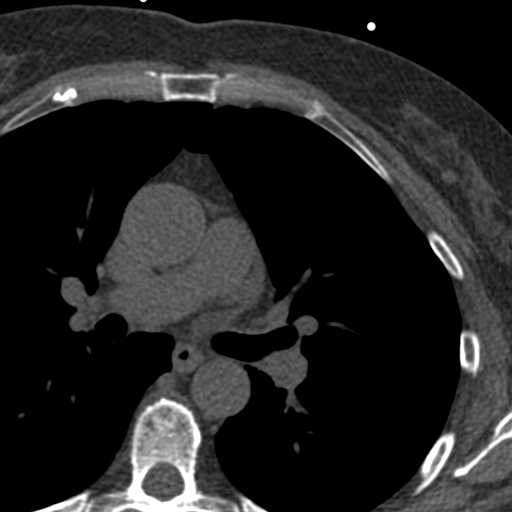

[Series 3: cascseq 2.0 bf37 st · axial · 0.61mm/px · z∈[-217,-117]mm · 5 of 76 slices shown, 7 images]
[im 13/76  vessel]
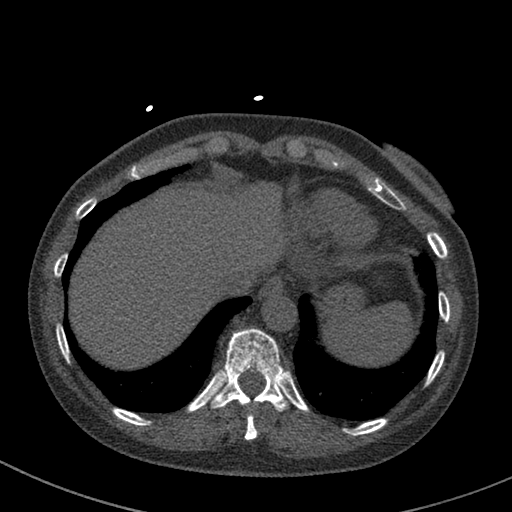
[im 13/76  lung]
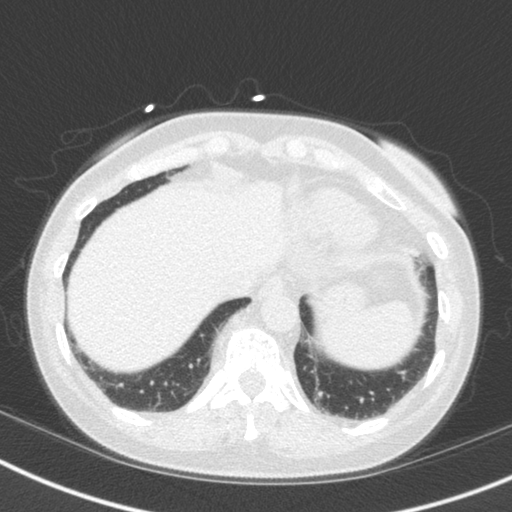
[im 26/76  vessel]
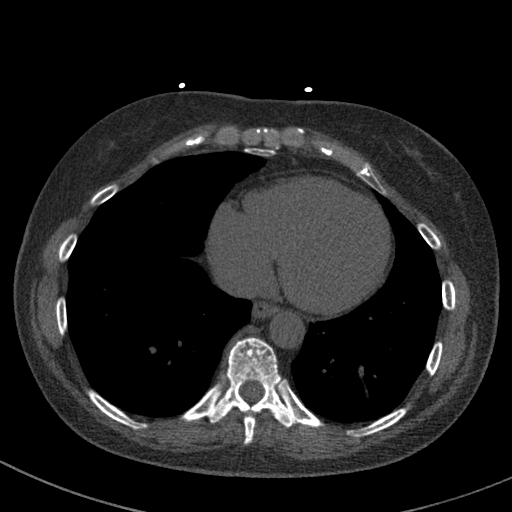
[im 38/76  vessel]
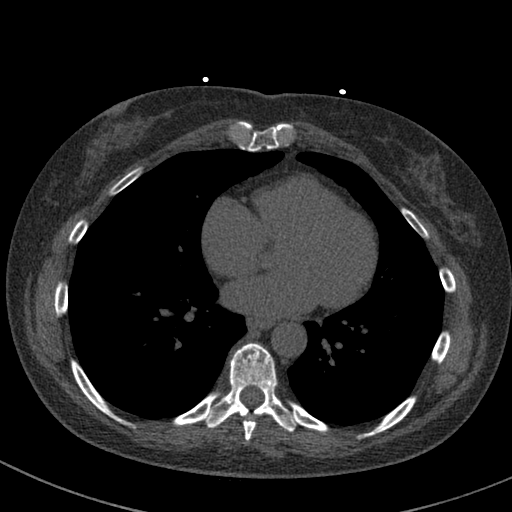
[im 51/76  vessel]
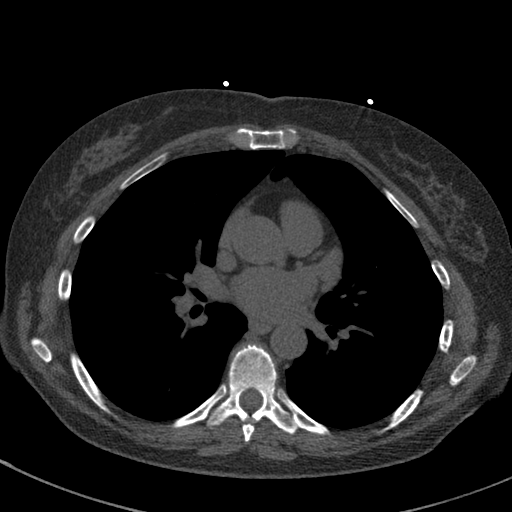
[im 63/76  vessel]
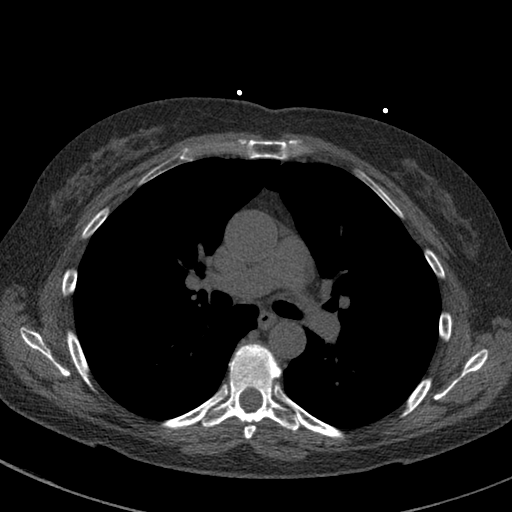
[im 63/76  lung]
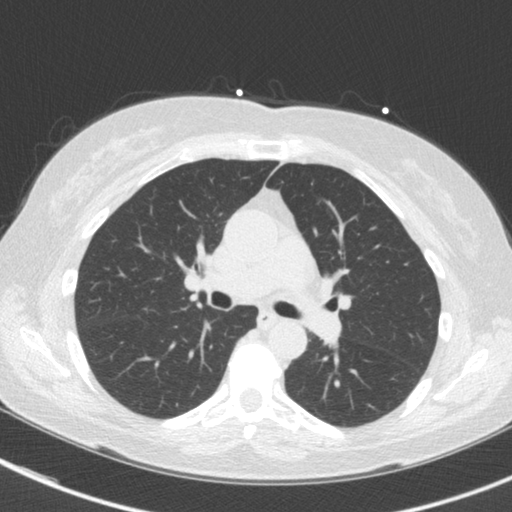

[Series 4: cascseq 2.0 br59 lung · axial · 0.61mm/px · z∈[-217,-117]mm · 5 of 76 slices shown]
[im 13/76  lung]
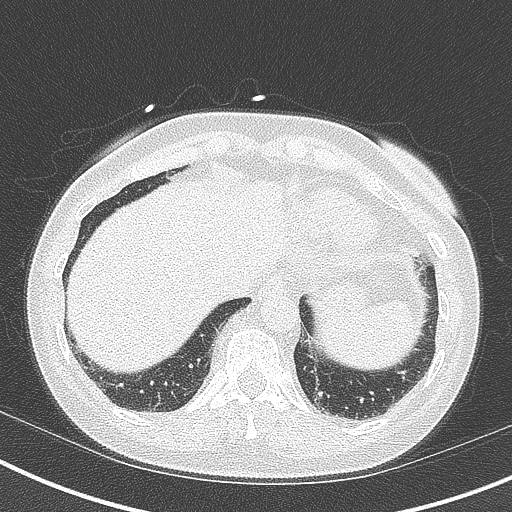
[im 26/76  lung]
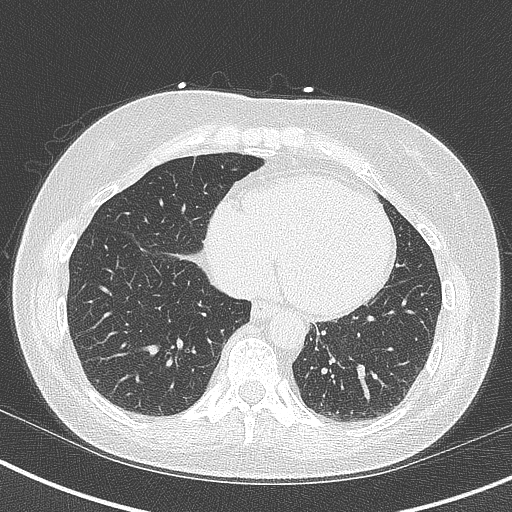
[im 38/76  lung]
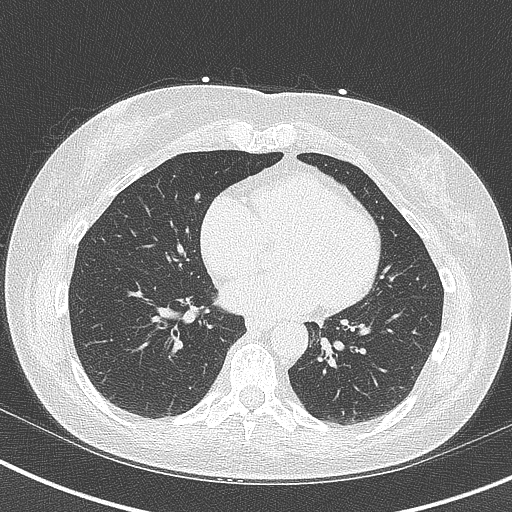
[im 51/76  lung]
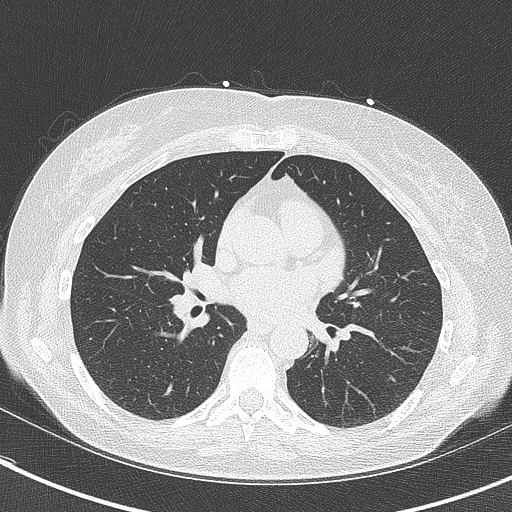
[im 63/76  lung]
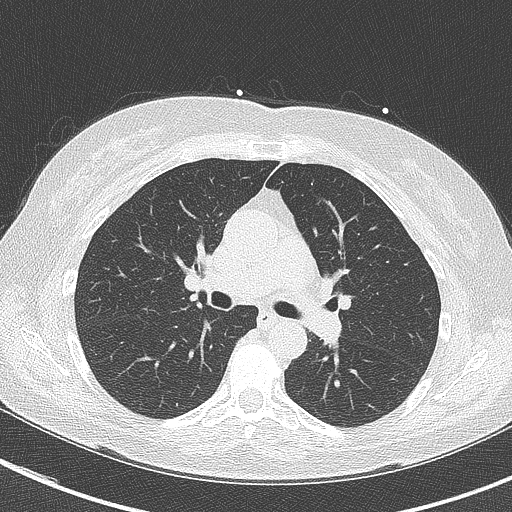

[14 of 20 positions shown; findings below may reference images not displayed]

FINDINGS: Vascular: Visualized portion of extracardiac vascular structures are
unremarkable.

Mediastinum/Nodes: Visualized mediastinum is unremarkable.

Lungs/Pleura: Visualized pulmonary parenchyma is unremarkable.

Upper Abdomen: Possible 17 mm left adrenal nodule.

Musculoskeletal: No chest wall mass or suspicious bone lesions
identified.
IMPRESSION: Possible 17 mm left adrenal nodule which is incompletely visualized
in the upper abdomen. Further evaluation with CT scan of the abdomen
is recommended. No other definite abnormality seen involving the
visualized extracardiac structures of the chest.
FINDINGS: Coronary arteries: Normal origins.

Coronary Calcium Score:

Left main: 0

Left anterior descending artery:

Left circumflex artery: 0

Right coronary artery: 0

Total:

Percentile: 69th

Pericardium: Normal.

Aorta: Normal caliber.

Non-cardiac: See separate report from [REDACTED].
IMPRESSION: Coronary calcium score of 3.87. This was 69th percentile for age-,
race-, and sex-matched controls.



If CAC=0, it is reasonable to withhold statin therapy and reassess
in 5 to 10 years, as long as higher risk conditions are absent
(diabetes mellitus, family history of premature CHD in first degree
relatives (males <55 years; females <65 years), cigarette smoking,
or LDL >=190 mg/dL).

If CAC is 1 to 99, it is reasonable to initiate statin therapy for
patients >=55 years of age.

If CAC is >=100 or >=75th percentile, it is reasonable to initiate
statin therapy at any age.

Cardiology referral should be considered for patients with CAC
scores >=400 or >=75th percentile.

*7708 AHA/ACC/AACVPR/AAPA/ABC/IGOREF/BANCA [place]Y/NGHIDIPOHAMBA/Osegueda/BAGUIDY/MOEN/EIRAS
Guideline on the Management of Blood Cholesterol: A Report of the
American College of Cardiology/American Heart Association Task Force
on Clinical Practice Guidelines. J Am Coll Cardiol.
0555;73(24):5156-5628.

*** End of Addendum ***
EXAM:
OVER-READ INTERPRETATION  CT CHEST

The following report is a limited chest CT over-read performed by
11/10/2021. The calcium coronary score interpretation by the
cardiologist is attached.
FINDINGS: Vascular: Visualized portion of extracardiac vascular structures are
unremarkable.

Mediastinum/Nodes: Visualized mediastinum is unremarkable.

Lungs/Pleura: Visualized pulmonary parenchyma is unremarkable.

Upper Abdomen: Possible 17 mm left adrenal nodule.

Musculoskeletal: No chest wall mass or suspicious bone lesions
identified.
IMPRESSION: Possible 17 mm left adrenal nodule which is incompletely visualized
in the upper abdomen. Further evaluation with CT scan of the abdomen
is recommended. No other definite abnormality seen involving the
visualized extracardiac structures of the chest.

## 2023-12-07 ENCOUNTER — Encounter: Payer: Self-pay | Admitting: Internal Medicine

## 2023-12-15 ENCOUNTER — Encounter: Payer: Self-pay | Admitting: Internal Medicine

## 2023-12-15 NOTE — Progress Notes (Unsigned)
    Subjective:    Patient ID: Tara Carpenter, female    DOB: 07-Jul-1960, 63 y.o.   MRN: [id number]      HPI Shanikka is here for No chief complaint on file.   Left hip/knee pain-     Medications and allergies reviewed with patient and updated if appropriate.  Current Outpatient Medications on File Prior to Visit  Medication Sig Dispense Refill   albuterol  (VENTOLIN  HFA) 108 (90 Base) MCG/ACT inhaler Inhale 1-2 puffs into the lungs every 6 (six) hours as needed for wheezing or shortness of breath. 8.5 g 11   azithromycin  (ZITHROMAX ) 250 MG tablet Take two tabs the first day and then one tab daily for four days 6 tablet 0   budesonide -formoterol  (SYMBICORT ) 160-4.5 MCG/ACT inhaler Inhale 2 puffs into the lungs 2 (two) times daily. 10.2 g 2   Calcium  Carbonate-Vitamin D  (CALCIUM  + D PO) Take 1 tablet by mouth daily.     Cetirizine HCl (ZYRTEC ALLERGY  PO) Take 1 tablet by mouth daily.     chlorpheniramine-HYDROcodone (TUSSIONEX) 10-8 MG/5ML Take 5 mLs by mouth every 12 (twelve) hours as needed. 115 mL 0   Dupilumab (DUPIXENT) 300 MG/2ML SOPN Inject into the skin. Monthly     famotidine  (PEPCID ) 20 MG tablet Take 1 tablet (20 mg total) by mouth at bedtime. (Patient taking differently: Take 20 mg by mouth at bedtime as needed.) 90 tablet 1   Multiple Vitamin (MULTIVITAMIN) capsule Take 1 capsule by mouth daily.     rosuvastatin  (CRESTOR ) 5 MG tablet TAKE 1 TABLET(5 MG) BY MOUTH DAILY 90 tablet 0   No current facility-administered medications on file prior to visit.    Review of Systems     Objective:  There were no vitals filed for this visit. BP Readings from Last 3 Encounters:  08/30/23 132/72  02/26/23 116/70  09/17/22 108/82   Wt Readings from Last 3 Encounters:  08/30/23 136 lb (61.7 kg)  02/26/23 135 lb (61.2 kg)  09/17/22 134 lb (60.8 kg)   There is no height or weight on file to calculate BMI.    Physical Exam         Assessment & Plan:    See Problem  List for Assessment and Plan of chronic medical problems.

## 2023-12-16 ENCOUNTER — Ambulatory Visit: Admitting: Internal Medicine

## 2023-12-16 ENCOUNTER — Ambulatory Visit (INDEPENDENT_AMBULATORY_CARE_PROVIDER_SITE_OTHER)

## 2023-12-16 VITALS — BP 128/74 | HR 80 | Temp 97.7°F | Ht 61.75 in | Wt 137.0 lb

## 2023-12-16 DIAGNOSIS — M5416 Radiculopathy, lumbar region: Secondary | ICD-10-CM

## 2023-12-16 DIAGNOSIS — M25562 Pain in left knee: Secondary | ICD-10-CM

## 2023-12-16 DIAGNOSIS — M25552 Pain in left hip: Secondary | ICD-10-CM

## 2023-12-16 MED ORDER — PREDNISONE 20 MG PO TABS: 40.0000 mg | ORAL_TABLET | Freq: Every day | ORAL | 0 refills | Status: AC

## 2023-12-16 NOTE — Assessment & Plan Note (Signed)
 Acute Symptoms consistent with lower back pain with lumbar radiculopathy, left side X-ray 5 years ago showed mild arthritis in the lumbar spine Will recheck x-ray today Referral for physical therapy Prednisone  40 mg daily x 5 days Okay to take Tylenol as needed If symptoms are not improving will refer to orthopedics

## 2023-12-16 NOTE — Assessment & Plan Note (Signed)
 Acute Left knee pain likely related to her lumbar radiculopathy Referred to physical therapy Prednisone  40 mg daily x 5 days She will update me if there is no improvement

## 2023-12-16 NOTE — Patient Instructions (Addendum)
     Have an xray downstairs.    Medications changes include :   prednisone  40 mg daily x 5 days    A referral was ordered physical therapy and someone will call you to schedule an appointment.     Return if symptoms worsen or fail to improve.

## 2023-12-23 ENCOUNTER — Ambulatory Visit: Payer: Self-pay | Admitting: Internal Medicine

## 2024-01-11 ENCOUNTER — Encounter: Payer: Self-pay | Admitting: Nurse Practitioner

## 2024-01-11 ENCOUNTER — Other Ambulatory Visit (HOSPITAL_COMMUNITY)
Admission: RE | Admit: 2024-01-11 | Discharge: 2024-01-11 | Disposition: A | Discharge status: Home / Self Care | Source: Ambulatory Visit | Attending: Nurse Practitioner | Admitting: Nurse Practitioner

## 2024-01-11 ENCOUNTER — Ambulatory Visit (INDEPENDENT_AMBULATORY_CARE_PROVIDER_SITE_OTHER): Admitting: Nurse Practitioner

## 2024-01-11 VITALS — BP 124/76 | HR 61 | Ht 62.0 in | Wt 136.0 lb

## 2024-01-11 DIAGNOSIS — Z780 Asymptomatic menopausal state: Secondary | ICD-10-CM | POA: Diagnosis not present

## 2024-01-11 DIAGNOSIS — Z124 Encounter for screening for malignant neoplasm of cervix: Secondary | ICD-10-CM

## 2024-01-11 DIAGNOSIS — M85851 Other specified disorders of bone density and structure, right thigh: Secondary | ICD-10-CM | POA: Diagnosis not present

## 2024-01-11 DIAGNOSIS — Z01419 Encounter for gynecological examination (general) (routine) without abnormal findings: Secondary | ICD-10-CM

## 2024-01-11 DIAGNOSIS — Z1331 Encounter for screening for depression: Secondary | ICD-10-CM

## 2024-01-11 NOTE — Progress Notes (Signed)
 Tara Carpenter 1960-08-24 [id number]   History:  63 y.o. [id number] presents for annual exam without GYN complaints. Postmenopausal - no HRT, no bleeding. Abnormal pap > 25 years ago, subsequent paps normal. Normal mammogram history. Prediabetes, HLD managed by PCP.   Gynecologic History Patient's last menstrual period was 07/15/2011.   Contraception/Family planning: post menopausal status Sexually active: Yes  Health Maintenance Last Pap: 07/19/2018. Results were: Normal neg HPV Last mammogram: 11/03/2023. Results were: Normal Last colonoscopy: 11/12/2021. Results were: Benign polyp, 5-year recall Last Dexa: 09/30/2021. Results were: T-score -1.9, FRAX 8.8% / 1.0%     12/16/2023   11:30 AM  Depression screen PHQ 2/9  Decreased Interest 0  Down, Depressed, Hopeless 0  PHQ - 2 Score 0  Altered sleeping 0  Tired, decreased energy 0  Change in appetite 0  Feeling bad or failure about yourself  0  Trouble concentrating 0  Moving slowly or fidgety/restless 0  Suicidal thoughts 0  PHQ-9 Score 0  Difficult doing work/chores Not difficult at all     Past medical history, past surgical history, family history and social history were all reviewed and documented in the EPIC chart. Married. Travel Agent. 2 sons (both in [place]), 1 daughter in [place]. Father had diabetes, deceased from heart disease in his 18s. Mother was diabetic, deceased from colon cancer.   ROS:  A ROS was performed and pertinent positives and negatives are included.  Exam:  Vitals:   01/11/24 0913  BP: 124/76  Pulse: 61  SpO2: 99%  Weight: 136 lb (61.7 kg)  Height: 5' 2 (1.575 m)      Body mass index is 24.87 kg/m.  General appearance:  Normal Thyroid :  Symmetrical, normal in size, without palpable masses or nodularity. Respiratory  Auscultation:  Clear without wheezing or rhonchi Cardiovascular  Auscultation:  Regular rate, without rubs, murmurs or gallops  Edema/varicosities:  Not grossly  evident Abdominal  Soft,nontender, without masses, guarding or rebound.  Liver/spleen:  No organomegaly noted  Hernia:  None appreciated  Skin  Inspection:  Grossly normal   Breasts: Examined lying and sitting.   Right: Without masses, retractions, discharge or axillary adenopathy.   Left: Without masses, retractions, discharge or axillary adenopathy. Pelvic: External genitalia:  no lesions              Urethra:  normal appearing urethra with no masses, tenderness or lesions              Bartholins and Skenes: normal                 Vagina: normal appearing vagina with normal color and discharge, no lesions              Cervix: no lesions Bimanual Exam:  Uterus:  no masses or tenderness              Adnexa: no mass, fullness, tenderness              Rectovaginal: Deferred              Anus:  normal, no lesions  [place], CMA present as chaperone.   Assessment/Plan:  63 y.o. [id number] for annual exam.   Well female exam with routine gynecological exam - Education provided on SBEs, importance of preventative screenings, current guidelines, high calcium  diet, regular exercise, and multivitamin daily. Labs with PCP.   Postmenopausal - no HRT, no bleeding  Osteopenia of neck of right femur - T-score -1.9 without  elevated FRAX 09/2021. Recommend Vitamin D  supplement, high calcium  diet, and exercise. Will repeat DXA now.   Cervical cancer screening - Plan: Cytology - PAP( [place]). Abnormal pap > 25 years ago, subsequent paps normal. Pap today per guidelines.   Screening for breast cancer - Normal mammogram history.  Continue annual screenings.  Normal breast exam today.  Screening for colon cancer - 10/2021 colonoscopy. Will repeat at GI's recommended interval. Mother deceased from colon cancer.   Return in about 1 year (around 01/10/2025) for Annual.     Tara DELENA Shutter DNP, 10:08 AM 01/11/2024

## 2024-01-12 ENCOUNTER — Ambulatory Visit: Payer: Self-pay | Admitting: Nurse Practitioner

## 2024-01-12 LAB — CYTOLOGY - PAP
Comment: NEGATIVE
Diagnosis: NEGATIVE
High risk HPV: NEGATIVE

## 2024-01-26 ENCOUNTER — Encounter: Payer: Self-pay | Admitting: Internal Medicine

## 2024-01-26 DIAGNOSIS — M5416 Radiculopathy, lumbar region: Secondary | ICD-10-CM

## 2024-01-28 NOTE — Addendum Note (Signed)
 Addended by: GEOFM GLADE PARAS on: 01/28/2024 05:21 PM   Modules accepted: Orders

## 2024-01-31 ENCOUNTER — Ambulatory Visit
Admission: RE | Admit: 2024-01-31 | Discharge: 2024-01-31 | Disposition: A | Discharge status: Home / Self Care | Source: Ambulatory Visit | Attending: Internal Medicine

## 2024-01-31 DIAGNOSIS — M5416 Radiculopathy, lumbar region: Secondary | ICD-10-CM

## 2024-02-09 ENCOUNTER — Encounter: Payer: Self-pay | Admitting: Internal Medicine

## 2024-03-13 ENCOUNTER — Other Ambulatory Visit: Payer: Self-pay

## 2024-03-13 ENCOUNTER — Ambulatory Visit (INDEPENDENT_AMBULATORY_CARE_PROVIDER_SITE_OTHER): Admitting: Orthopaedic Surgery

## 2024-03-13 DIAGNOSIS — M5116 Intervertebral disc disorders with radiculopathy, lumbar region: Secondary | ICD-10-CM | POA: Diagnosis not present

## 2024-03-13 DIAGNOSIS — M5416 Radiculopathy, lumbar region: Secondary | ICD-10-CM

## 2024-03-13 NOTE — Progress Notes (Signed)
 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  She started develop some back pain and sciatic pain around May of this year.  This was radiating down her leg to behind her knee and her calf area.  At the moment is feeling a little bit better and she has worked on activity modification.  She is active and not obese.  She cannot take traditional anti-inflammatories due to asthma but she does take acetaminophen and that has helped some.  She denies any significant weakness but still has the same symptoms in terms of the pain to the left side.  On exam today she does not have a true positive straight leg raise to the left side but it is slightly painful.  She does have symptoms in the L3-L4 distribution in terms of radicular symptoms but no overt weakness.  She has good mobility overall.  The imaging studies are reviewed with her and it does show that she does have a disc protrusion to the left side at L3-L4 that appears contained but does look like it is potentially affecting the L3 nerve.  We did show a spine model and I recommended an epidural steroid injection as this would help with her symptoms.  She agrees to trying this treatment plan as well.  Will see Dr. Eldonna can provide a left-sided L3-L4 ESI in the near future and then can get her back to us  several weeks later.  She agrees with the treatment plan.

## 2024-03-17 ENCOUNTER — Encounter: Payer: Self-pay | Admitting: Nurse Practitioner

## 2024-03-23 ENCOUNTER — Telehealth: Payer: Self-pay | Admitting: Physical Medicine and Rehabilitation

## 2024-03-23 NOTE — Telephone Encounter (Signed)
 Patient called and needs to reschedule her appointment. CB#[phone]

## 2024-03-27 ENCOUNTER — Encounter: Payer: Self-pay | Admitting: Radiology

## 2024-04-03 ENCOUNTER — Encounter: Admitting: Physical Medicine and Rehabilitation

## 2024-04-19 ENCOUNTER — Telehealth: Payer: Self-pay

## 2024-04-19 ENCOUNTER — Telehealth: Payer: Self-pay | Admitting: Physical Medicine and Rehabilitation

## 2024-04-19 NOTE — Telephone Encounter (Signed)
 Just and FYI: Patient called and wants to cancel the appointment. No r/s. CB#[phone]  Referral closed due to patient declined treatmnet

## 2024-04-19 NOTE — Telephone Encounter (Signed)
 Patient called and wants to cancel the appointment. No r/s. CB#[phone]

## 2024-04-24 ENCOUNTER — Encounter: Admitting: Physical Medicine and Rehabilitation

## 2024-05-04 ENCOUNTER — Encounter: Payer: Self-pay | Admitting: Internal Medicine

## 2024-05-29 MED ORDER — LOPERAMIDE HCL 2 MG PO CAPS: ORAL_CAPSULE | ORAL | 0 refills | Status: AC

## 2024-05-29 MED ORDER — ATOVAQUONE-PROGUANIL HCL 250-100 MG PO TABS: 1.0000 | ORAL_TABLET | Freq: Every day | ORAL | 0 refills | Status: AC

## 2024-05-31 ENCOUNTER — Other Ambulatory Visit: Payer: Self-pay | Admitting: Internal Medicine

## 2024-06-01 ENCOUNTER — Encounter: Payer: Self-pay | Admitting: Orthopaedic Surgery

## 2024-06-07 ENCOUNTER — Encounter: Payer: Self-pay | Admitting: Orthopaedic Surgery

## 2024-06-07 ENCOUNTER — Ambulatory Visit (INDEPENDENT_AMBULATORY_CARE_PROVIDER_SITE_OTHER): Admitting: Orthopaedic Surgery

## 2024-06-07 ENCOUNTER — Other Ambulatory Visit (INDEPENDENT_AMBULATORY_CARE_PROVIDER_SITE_OTHER)

## 2024-06-07 DIAGNOSIS — M25561 Pain in right knee: Secondary | ICD-10-CM

## 2024-06-07 MED ORDER — LIDOCAINE HCL 1 % IJ SOLN
3.0000 mL | INTRAMUSCULAR | Status: AC | PRN
  Administered 2024-06-07: 3 mL

## 2024-06-07 MED ORDER — METHYLPREDNISOLONE ACETATE 40 MG/ML IJ SUSP
40.0000 mg | INTRAMUSCULAR | Status: AC | PRN
  Administered 2024-06-07: 40 mg via INTRA_ARTICULAR

## 2024-06-07 NOTE — Progress Notes (Signed)
 Patient is a very active 64 year old female well-known to me.  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  Recently she has been dealing with the right knee pain.  It is a global type of pain and occasionally some swelling but definitely pain with pivoting activities with the right knee.  There is been no known injury.  She does walk quite a bit and travels quite a bit as well.  Examination of her right knee today shows no effusion but pain throughout the arc of motion of the right knee.  It feels ligamentously stable but she does have irritation at the joint line when I rotate the tibia on the femur.  X-rays of her right knee show well-maintained joint space at the medial lateral compartments with some patellofemoral chondromalacia.  I did recommend a steroid injection in her right knee today which she agreed to and tolerated well.  She has a trip in early March so we will see her back in just 4 weeks to see how she is doing overall.  All questions concerns were answered and addressed.  She may end up needing an MRI of her right knee if she continues to have any mechanical symptoms.    Procedure Note  Patient: Tara Carpenter             Date of Birth: December 24, 1960           MRN: [id number]             Visit Date: 06/07/2024  Procedures: Visit Diagnoses:  1. Acute pain of right knee     Large Joint Inj: R knee on 06/07/2024 11:42 AM Indications: diagnostic evaluation and pain Details: 22 G 1.5 in needle, superolateral approach  Arthrogram: No  Medications: 3 mL lidocaine  1 %; 40 mg methylPREDNISolone  acetate 40 MG/ML Outcome: tolerated well, no immediate complications Procedure, treatment alternatives, risks and benefits explained, specific risks discussed. Consent was given by the patient. Immediately prior to procedure a time out was called to verify the correct patient, procedure, equipment, support staff and site/side marked as required. Patient was prepped and draped in the  usual sterile fashion.

## 2024-06-20 ENCOUNTER — Other Ambulatory Visit: Payer: Self-pay

## 2024-06-20 ENCOUNTER — Encounter: Payer: Self-pay | Admitting: Orthopaedic Surgery

## 2024-06-20 DIAGNOSIS — G8929 Other chronic pain: Secondary | ICD-10-CM

## 2024-06-27 ENCOUNTER — Ambulatory Visit
Admission: RE | Admit: 2024-06-27 | Discharge: 2024-06-27 | Disposition: A | Source: Ambulatory Visit | Attending: Orthopaedic Surgery | Admitting: Orthopaedic Surgery

## 2024-06-27 DIAGNOSIS — G8929 Other chronic pain: Secondary | ICD-10-CM

## 2024-06-29 ENCOUNTER — Encounter: Payer: Self-pay | Admitting: Internal Medicine

## 2024-07-03 ENCOUNTER — Ambulatory Visit: Admitting: Orthopaedic Surgery

## 2024-07-05 ENCOUNTER — Ambulatory Visit: Admitting: Orthopaedic Surgery
